# Patient Record
Sex: Male | Born: 1965
Health system: Southern US, Community
[De-identification: ages and names within clinical notes are randomized; demographics above are authoritative.]

## PROBLEM LIST (undated history)

## (undated) DIAGNOSIS — I10 Essential (primary) hypertension: Secondary | ICD-10-CM

## (undated) DIAGNOSIS — K760 Fatty (change of) liver, not elsewhere classified: Secondary | ICD-10-CM

## (undated) HISTORY — DX: Fatty (change of) liver, not elsewhere classified: K76.0

## (undated) HISTORY — PX: HERNIA REPAIR: SHX51

## (undated) HISTORY — DX: Essential (primary) hypertension: I10

## (undated) HISTORY — PX: ESOPHAGOGASTRODUODENOSCOPY: SHX1529

---

## 1988-07-19 LAB — HM HEPATITIS C SCREENING LAB: HM Hepatitis Screen: NEGATIVE

## 2004-07-22 ENCOUNTER — Ambulatory Visit: Payer: Self-pay | Admitting: Family Medicine

## 2005-02-02 ENCOUNTER — Ambulatory Visit: Payer: Self-pay | Admitting: Family Medicine

## 2005-12-24 ENCOUNTER — Ambulatory Visit: Payer: Self-pay | Admitting: Family Medicine

## 2005-12-31 ENCOUNTER — Ambulatory Visit: Payer: Self-pay | Admitting: Family Medicine

## 2006-01-14 ENCOUNTER — Ambulatory Visit: Payer: Self-pay | Admitting: Family Medicine

## 2006-01-27 ENCOUNTER — Ambulatory Visit: Payer: Self-pay | Admitting: Family Medicine

## 2006-05-10 ENCOUNTER — Ambulatory Visit: Payer: Self-pay | Admitting: Family Medicine

## 2006-05-12 ENCOUNTER — Ambulatory Visit: Payer: Self-pay | Admitting: Family Medicine

## 2006-08-18 ENCOUNTER — Ambulatory Visit: Payer: Self-pay | Admitting: Family Medicine

## 2007-05-02 DIAGNOSIS — I10 Essential (primary) hypertension: Secondary | ICD-10-CM | POA: Insufficient documentation

## 2007-05-19 ENCOUNTER — Ambulatory Visit: Payer: Self-pay | Admitting: Family Medicine

## 2007-05-19 LAB — CONVERTED CEMR LAB
BUN: 13 mg/dL (ref 6–23)
CO2: 29 meq/L (ref 19–32)
Calcium: 8.7 mg/dL (ref 8.4–10.5)
Chloride: 105 meq/L (ref 96–112)
Cholesterol: 155 mg/dL (ref 0–200)
Creatinine, Ser: 0.9 mg/dL (ref 0.4–1.5)
GFR calc Af Amer: 121 mL/min
GFR calc non Af Amer: 100 mL/min
Glucose, Bld: 109 mg/dL — ABNORMAL HIGH (ref 70–99)
HDL: 42.1 mg/dL (ref >39.0)
LDL Cholesterol: 102 mg/dL — ABNORMAL HIGH (ref 0–99)
Potassium: 4.1 meq/L (ref 3.5–5.1)
Sodium: 140 meq/L (ref 135–145)
TSH: 1.63 microintl units/mL (ref 0.35–5.50)
Total CHOL/HDL Ratio: 3.7
Triglycerides: 56 mg/dL (ref 0–149)
VLDL: 11 mg/dL (ref 0–40)

## 2007-05-24 ENCOUNTER — Ambulatory Visit: Payer: Self-pay | Admitting: Family Medicine

## 2007-08-29 ENCOUNTER — Ambulatory Visit: Payer: Self-pay | Admitting: Family Medicine

## 2008-09-02 ENCOUNTER — Ambulatory Visit: Payer: Self-pay | Admitting: Family Medicine

## 2008-09-02 DIAGNOSIS — E663 Overweight: Secondary | ICD-10-CM | POA: Insufficient documentation

## 2008-09-02 DIAGNOSIS — R0989 Other specified symptoms and signs involving the circulatory and respiratory systems: Secondary | ICD-10-CM | POA: Insufficient documentation

## 2008-09-02 DIAGNOSIS — R0609 Other forms of dyspnea: Secondary | ICD-10-CM | POA: Insufficient documentation

## 2008-11-27 ENCOUNTER — Ambulatory Visit: Payer: Self-pay | Admitting: Family Medicine

## 2008-11-27 LAB — CONVERTED CEMR LAB
ALT: 66 units/L — ABNORMAL HIGH (ref 0–53)
AST: 42 units/L — ABNORMAL HIGH (ref 0–37)
Albumin: 4.2 g/dL (ref 3.5–5.2)
Alkaline Phosphatase: 85 units/L (ref 39–117)
BUN: 16 mg/dL (ref 6–23)
Basophils Absolute: 0 10*3/uL (ref 0.0–0.1)
Basophils Relative: 0 % (ref 0.0–3.0)
Bilirubin, Direct: 0.1 mg/dL (ref 0.0–0.3)
CO2: 29 meq/L (ref 19–32)
Calcium: 9 mg/dL (ref 8.4–10.5)
Chloride: 107 meq/L (ref 96–112)
Cholesterol: 176 mg/dL (ref 0–200)
Creatinine, Ser: 0.9 mg/dL (ref 0.4–1.5)
Creatinine,U: 21.8 mg/dL
Eosinophils Absolute: 0.1 10*3/uL (ref 0.0–0.7)
Eosinophils Relative: 1.5 % (ref 0.0–5.0)
GFR calc non Af Amer: 98.73 mL/min (ref >60)
Glucose, Bld: 107 mg/dL — ABNORMAL HIGH (ref 70–99)
HCT: 44 % (ref 39.0–52.0)
HDL: 46.6 mg/dL (ref >39.00)
Hemoglobin: 15.7 g/dL (ref 13.0–17.0)
LDL Cholesterol: 116 mg/dL — ABNORMAL HIGH (ref 0–99)
Lymphocytes Relative: 34.1 % (ref 12.0–46.0)
Lymphs Abs: 1.8 10*3/uL (ref 0.7–4.0)
MCHC: 35.6 g/dL (ref 30.0–36.0)
MCV: 91.6 fL (ref 78.0–100.0)
Microalb Creat Ratio: 9.2 mg/g (ref 0.0–30.0)
Microalb, Ur: 0.2 mg/dL (ref 0.0–1.9)
Monocytes Absolute: 0.5 10*3/uL (ref 0.1–1.0)
Monocytes Relative: 9.8 % (ref 3.0–12.0)
Neutro Abs: 3 10*3/uL (ref 1.4–7.7)
Neutrophils Relative %: 54.6 % (ref 43.0–77.0)
Platelets: 187 10*3/uL (ref 150.0–400.0)
Potassium: 4.4 meq/L (ref 3.5–5.1)
RBC: 4.81 M/uL (ref 4.22–5.81)
RDW: 12 % (ref 11.5–14.6)
Sodium: 143 meq/L (ref 135–145)
TSH: 1.31 microintl units/mL (ref 0.35–5.50)
Total Bilirubin: 0.9 mg/dL (ref 0.3–1.2)
Total CHOL/HDL Ratio: 4
Total Protein: 6.9 g/dL (ref 6.0–8.3)
Triglycerides: 68 mg/dL (ref 0.0–149.0)
VLDL: 13.6 mg/dL (ref 0.0–40.0)
WBC: 5.4 10*3/uL (ref 4.5–10.5)

## 2008-12-03 ENCOUNTER — Ambulatory Visit: Payer: Self-pay | Admitting: Family Medicine

## 2009-03-06 ENCOUNTER — Ambulatory Visit: Payer: Self-pay | Admitting: Family Medicine

## 2009-12-04 ENCOUNTER — Ambulatory Visit: Payer: Self-pay | Admitting: Family Medicine

## 2009-12-04 DIAGNOSIS — E7800 Pure hypercholesterolemia, unspecified: Secondary | ICD-10-CM | POA: Insufficient documentation

## 2009-12-07 LAB — CONVERTED CEMR LAB
ALT: 42 units/L (ref 0–53)
AST: 26 units/L (ref 0–37)
Albumin: 4.4 g/dL (ref 3.5–5.2)
Alkaline Phosphatase: 94 units/L (ref 39–117)
BUN: 12 mg/dL (ref 6–23)
Bilirubin, Direct: 0.2 mg/dL (ref 0.0–0.3)
CO2: 29 meq/L (ref 19–32)
Calcium: 9.5 mg/dL (ref 8.4–10.5)
Chloride: 100 meq/L (ref 96–112)
Cholesterol: 164 mg/dL (ref 0–200)
Creatinine, Ser: 1 mg/dL (ref 0.4–1.5)
Creatinine,U: 13.7 mg/dL
GFR calc non Af Amer: 89.04 mL/min (ref >60)
Glucose, Bld: 96 mg/dL (ref 70–99)
HDL: 50.9 mg/dL (ref >39.00)
LDL Cholesterol: 91 mg/dL (ref 0–99)
Microalb Creat Ratio: 2.2 mg/g (ref 0.0–30.0)
Microalb, Ur: 0.3 mg/dL (ref 0.0–1.9)
Potassium: 4.9 meq/L (ref 3.5–5.1)
Sodium: 137 meq/L (ref 135–145)
TSH: 2.14 microintl units/mL (ref 0.35–5.50)
Total Bilirubin: 0.9 mg/dL (ref 0.3–1.2)
Total CHOL/HDL Ratio: 3
Total Protein: 7.1 g/dL (ref 6.0–8.3)
Triglycerides: 113 mg/dL (ref 0.0–149.0)
VLDL: 22.6 mg/dL (ref 0.0–40.0)

## 2009-12-10 ENCOUNTER — Ambulatory Visit: Payer: Self-pay | Admitting: Family Medicine

## 2010-01-14 ENCOUNTER — Ambulatory Visit: Payer: Self-pay | Admitting: Family Medicine

## 2010-01-21 ENCOUNTER — Ambulatory Visit: Payer: Self-pay | Admitting: Family Medicine

## 2010-06-08 ENCOUNTER — Telehealth: Payer: Self-pay | Admitting: Family Medicine

## 2010-08-18 NOTE — Assessment & Plan Note (Signed)
Summary: Marisue Ivan FROM [facility] F/U ON BP/DLO   Vital Signs:  Patient profile:   45 year old male Height:      68 inches Weight:      237.75 pounds BMI:     36.28 Temp:     98.5 degrees F oral Pulse rate:   84 / minute Pulse rhythm:   regular BP sitting:   132 / 80  (left arm) Cuff size:   large  Vitals Entered By: Delilah Shan CMA Koleen Celia Dull) (January 21, 2010 3:24 PM) CC: Transfer from Dr. Hetty Ely - F/U BP   History of Present Illness: Hypertension:      Using medication without problems or lightheadedness: yes Chest pain with exertion:no Edema:no Short of breath:no  Average home BPs:not checked.  Other issues: B foot pain- plantar pain.  Worse with first steps in AM, better after walking some.  No trauma, no edema.   Allergies: No Known Drug Allergies  Past History:  Past Medical History: Last updated: 05/02/2007 Hypertension  Family History: Last updated: 12/10/2009 Father: A 68 heart attack, coronary disease, and high blood pressure, COPD Mother: A 28 Brother A 39 Scott  divertics Brother A 37 Matt CV + Father MI, PGF CAD HBP + Father DM + in family (cousins) Breast/Ovarian/Uterine cancer + cousins Depression - none ETOH/Drug abuse - none Stroke + PGM, MGM  Review of Systems       See HPI.  Otherwise noncontributory.    Physical Exam  General:  GEN: nad, alert and oriented HEENT: mucous membranes moist NECK: supple w/o LA, no bruit CV: rrr.  no murmur PULM: ctab, no inc wob EXT: no edema SKIN: no acute rash  Feet:mild  B distal arch tenderness w/o erythema or other skin changes.    Impression & Recommendations:  Problem # 1:  HYPERTENSION (ICD-401.9) Assessment Improved Continue current meds.  His updated medication list for this problem includes:    Toprol Xl 50 Mg Xr24h-tab (Metoprolol succinate) ..... One tab by mouth at night  Complete Medication List: 1)  Toprol Xl 50 Mg Xr24h-tab (Metoprolol succinate) .... One tab by mouth at  night 2)  Rolaids  .... As needed 3)  Vitamin B-12 1000 Mcg Tabs (Cyanocobalamin) .... Otc 1 dauly with zinc  Patient Instructions: 1)  Get soft arch supports for shoes and stretch the bottom of your foot each morning before getting out of bed.  Continue your toprol 50mg  a day.  Try to cut down on salt.  Keep walking.   2)  Please schedule a follow-up appointment in 1 year.  Prescriptions: TOPROL XL 50 MG XR24H-TAB (METOPROLOL SUCCINATE) one tab by mouth at night  #30 x 12   Entered and Authorized by:   Crawford Givens MD   Signed by:   Crawford Givens MD on 01/21/2010   Method used:   [facility]        [facility]  [address] [phone][facility] (retail[facility]       [address]       [place], [place]  [address]       Ph: [phone]       Fax: [phone]   RxID:   [id number]   Current Allergies (reviewed today): No known allergies

## 2010-08-18 NOTE — Assessment & Plan Note (Signed)
Summary: CPX / LFW   Vital Signs:  Patient profile:   45 year old male Height:      68 inches Weight:      231 pounds BMI:     35.25 Temp:     98.9 degrees F oral Pulse rate:   88 / minute Pulse rhythm:   regular BP sitting:   148 / 78  (left arm) Cuff size:   large  Vitals Entered By: Sydell Axon LPN (Dec 10, 2009 2:50 PM) CC: 30 minute checkup   History of Present Illness: Pt here for Comp Exam. He has no complaints He has lots graduating the last few weeks and hasn't been able to exercise. He has no complaints.  Preventive Screening-Counseling & Management  Alcohol-Tobacco     Alcohol drinks/day: 2     Alcohol type: beer, scotch     Smoking Status: never     Passive Smoke Exposure: no  Caffeine-Diet-Exercise     Caffeine use/day: 3     Does Patient Exercise: yes     Type of exercise: walking     Times/week: 3  Problems Prior to Update: 1)  Hypercholesterolemia  (ICD-272.0) 2)  Snoring  (ICD-786.09) 3)  Overweight  (ICD-278.02) 4)  Screening For Lipoid Disorders  (ICD-V77.91) 5)  Health Maintenance Exam  (ICD-V70.0) 6)  Hypertension  (ICD-401.9) 7)  Hyperglycemia (107)  (ICD-790.29) 8)  Helicobacter Pylori Gastritis/esoph Stricture, Barrett's  (ICD-041.86)  Medications Prior to Update: 1)  Toprol Xl 50 Mg Xr24h-Tab (Metoprolol Succinate) .... One Tab By Mouth At Night 2)  Rolaids .... As Needed 3)  Vitamin B-12 1000 Mcg Tabs (Cyanocobalamin) .... Otc 1 Dauly With Zinc  Allergies: No Known Drug Allergies  Past History:  Past Medical History: Last updated: 05/02/2007 Hypertension  Past Surgical History: Last updated: 05/02/2007 Herniorraphy 2 wks old EGD, bx negative stricture secondary reflux, Barrett's negative 03/20/02, + H Pylori 03/20/02 ABD U/S negative, fatty liver, left kidney prom soft tissure - CT 01/27/06 CT abd/pelvis dromedary hump of kidney 02/03/06  Family History: Last updated: 12/10/2009 Father: A 68 heart attack, coronary  disease, and high blood pressure, COPD Mother: A 81 Brother A 39 Scott  divertics Brother A 37 Matt CV + Father MI, PGF CAD HBP + Father DM + in family (cousins) Breast/Ovarian/Uterine cancer + cousins Depression - none ETOH/Drug abuse - none Stroke + PGM, MGM  Social History: Last updated: 05/24/2007 Marital Status: Married Children: One son and one daughter one son Occupation: Works at [employer]  Risk Factors: Alcohol Use: 2 (12/10/2009) Caffeine Use: 3 (12/10/2009) Exercise: yes (12/10/2009)  Risk Factors: Smoking Status: never (12/10/2009) Passive Smoke Exposure: no (12/10/2009)  Family History: Father: A 68 heart attack, coronary disease, and high blood pressure, COPD Mother: A 73 Brother A 39 Scott  divertics Brother A 37 Matt CV + Father MI, PGF CAD HBP + Father DM + in family (cousins) Breast/Ovarian/Uterine cancer + cousins Depression - none ETOH/Drug abuse - none Stroke + PGM, MGM  Review of Systems General:  Complains of fatigue; denies chills, fever, sweats, weakness, and weight loss; late afternoon. Eyes:  Denies blurring, discharge, and eye irritation. ENT:  Complains of decreased hearing; denies earache and ringing in ears; R>L. CV:  Complains of swelling of hands; denies chest pain or discomfort, fainting, fatigue, palpitations, shortness of breath with exertion, swelling of feet, and weight gain. Resp:  Denies cough, shortness of breath, and wheezing. GI:  Denies abdominal pain, bloody stools, change in  bowel habits, constipation, dark tarry stools, diarrhea, indigestion, loss of appetite, nausea, vomiting, vomiting blood, and yellowish skin color. GU:  Denies discharge, dysuria, nocturia, and urinary frequency. MS:  Complains of low back pain; denies joint pain, muscle aches, cramps, and stiffness; occas. Derm:  Denies dryness, itching, and rash. Neuro:  Denies numbness, poor balance, tingling, and tremors.  Physical Exam  General:   Well-developed,well-nourished,in no acute distress; alert,appropriate and cooperative throughout examination, weight improving but still overweight . Head:  Normocephalic and atraumatic without obvious abnormalities. No apparent alopecia or balding. Sinuses nontender. Eyes:  Conjunctiva clear bilaterally.  Ears:  External ear exam shows no significant lesions or deformities.  Otoscopic examination reveals clear canals, tympanic membranes are intact bilaterally without bulging, retraction, inflammation or discharge. Hearing is grossly normal bilaterally. Nose:  External nasal examination shows no deformity or inflammation. Nasal mucosa are pink and moist without lesions or exudates. Mouth:  Oral mucosa and oropharynx without lesions or exudates.  Teeth in good repair. Neck:  No deformities, masses, or tenderness noted. Chest Wall:  No deformities, masses, tenderness or gynecomastia noted. Breasts:  No masses or gynecomastia noted Lungs:  Normal respiratory effort, chest expands symmetrically. Lungs are clear to auscultation, no crackles or wheezes. Heart:  Normal rate and regular rhythm. S1 and S2 normal without gallop, murmur, click, rub or other extra sounds. Abdomen:  Bowel sounds positive,abdomen soft and non-tender without masses, organomegaly or hernias noted. Rectal:  No external abnormalities noted. Normal sphincter tone. No rectal masses or tenderness. G neg. Genitalia:  Testes bilaterally descended without nodularity, tenderness or masses. No scrotal masses or lesions. No penis lesions or urethral discharge. Prostate:  Prostate gland firm and smooth, no enlargement, nodularity, tenderness, mass, asymmetry or induration. 10-20gms. Msk:  No deformity or scoliosis noted of thoracic or lumbar spine.   Pulses:  R and L carotid,radial,femoral,dorsalis pedis and posterior tibial pulses are full and equal bilaterally Extremities:  No clubbing, cyanosis, edema, or deformity noted with normal full  range of motion of all joints.   Neurologic:  No cranial nerve deficits noted. Station and gait are normal. Sensory, motor and coordinative functions appear intact. Skin:  Intact without suspicious lesions or rashes Cervical Nodes:  No lymphadenopathy noted Inguinal Nodes:  No significant adenopathy Psych:  Cognition and judgment appear intact. Alert and cooperative with normal attention span and concentration. No apparent delusions, illusions, hallucinations   Impression & Recommendations:  Problem # 1:  HEALTH MAINTENANCE EXAM (ICD-V70.0) Assessment Comment Only  Reviewed preventive care protocols, scheduled due services, and updated immunizations.  Problem # 2:  HYPERTENSION (ICD-401.9) Assessment: Deteriorated Up again today. Is taking only 1/2 tab of Toprol at night. Go to whole tab. His updated medication list for this problem includes:    Toprol Xl 50 Mg Xr24h-tab (Metoprolol succinate) ..... One tab by mouth at night  BP today: 148/78 Prior BP: 130/76 (03/06/2009)  Labs Reviewed: K+: 4.9 (12/04/2009) Creat: : 1.0 (12/04/2009)   Chol: 164 (12/04/2009)   HDL: 50.90 (12/04/2009)   LDL: 91 (12/04/2009)   TG: 113.0 (12/04/2009)  Problem # 3:  OVERWEIGHT (ICD-278.02) Assessment: Improved  Slowly gertting better, encouraged to cont.efforts.  Ht: 68 (12/10/2009)   Wt: 231 (12/10/2009)   BMI: 35.25 (12/10/2009)  Problem # 4:  SNORING (ICD-786.09) Assessment: Unchanged  Stable. His updated medication list for this problem includes:    Toprol Xl 50 Mg Xr24h-tab (Metoprolol succinate) ..... One tab by mouth at night  Problem # 5:  HYPERCHOLESTEROLEMIA (ICD-272.0)  Assessment: Unchanged Stable and good. Labs Reviewed: SGOT: 26 (12/04/2009)   SGPT: 42 (12/04/2009)   HDL:50.90 (12/04/2009), 46.60 (11/27/2008)  LDL:91 (12/04/2009), 116 (03/47/4259)  Chol:164 (12/04/2009), 176 (11/27/2008)  Trig:113.0 (12/04/2009), 68.0 (11/27/2008)  Problem # 6:  HYPERGLYCEMIA (107)  (ICD-790.29) Assessment: Unchanged Euglycemic today, congrats. Cont efforts. Diet and exercise.  Complete Medication List: 1)  Toprol Xl 50 Mg Xr24h-tab (Metoprolol succinate) .... One tab by mouth at night 2)  Rolaids  .... As needed 3)  Vitamin B-12 1000 Mcg Tabs (Cyanocobalamin) .... Otc 1 dauly with zinc  Patient Instructions: 1)  RTC one month for BP check. Prescriptions: TOPROL XL 50 MG XR24H-TAB (METOPROLOL SUCCINATE) one tab by mouth at night  #30 x 12   Entered and Authorized by:   Shaune Leeks MD   Signed by:   Shaune Leeks MD on 12/10/2009   Method used:   [facility]        [facility] [phone]* (retail)       [address]       [place], [place]  [address]       Ph: [phone]       Fax: [phone]   RxID:   [id number]   Current Allergies (reviewed today): No known allergies

## 2010-08-18 NOTE — Assessment & Plan Note (Signed)
Summary: cpx/rbh   Vital Signs:  Patient Profile:   45 Years Old Male Height:     68.50 inches Weight:      237 pounds Temp:     99.3 degrees F oral Pulse rate:   72 / minute Pulse rhythm:   regular BP sitting:   130 / 80  (left arm) Cuff size:   large  Vitals Entered By: Providence Crosby (May 24, 2007 8:33 AM)                 Chief Complaint:  check up// .  History of Present Illness: No complaints, feels well. Lots of stress.  Current Allergies: No known allergies    Family History:    Father: Alive with heart attack, coronary disease, and high blood pressure, COPD    Mother: Alive    Siblings: 2 Brothers, 1 with divertics    CV + Father MI, PGF CAD    HBP + Father    DM + in family (cousins)    Breast/Ovarian/Uterine cancer + cousins    Depression - none    ETOH/Drug abuse - none    Stroke + PGM, MGM          Social History:    Marital Status: Married    Children: One son and one daughter one son    Occupation: Works at [employer]   Risk Factors:  Passive smoke exposure:  no Drug use:  no HIV high-risk behavior:  no Caffeine use:  3 drinks per day Alcohol use:  yes    Type:  beer, scotch    Drinks per day:  2    Has patient --       Felt need to cut down:  no       Been annoyed by complaints:  no       Felt guilty about drinking:  no       Needed eye opener in the morning:  no    Counseled to quit/cut down alcohol use:  no Exercise:  yes    Times per week:  2    Type:  walking Seatbelt use:  100 %   Review of Systems  General      Denies chills, fatigue, fever, loss of appetite, malaise, sleep disorder, sweats, weakness, and weight loss.  Eyes      Denies blurring, discharge, double vision, eye irritation, eye pain, halos, itching, light sensitivity, red eye, vision loss-1 eye, and vision loss-both eyes.  ENT      Denies decreased hearing, difficulty swallowing, ear discharge, earache, hoarseness, nasal congestion,  nosebleeds, postnasal drainage, ringing in ears, sinus pressure, and sore throat.  CV      Denies bluish discoloration of lips or nails, chest pain or discomfort, difficulty breathing at night, difficulty breathing while lying down, fainting, fatigue, leg cramps with exertion, lightheadness, near fainting, palpitations, shortness of breath with exertion, swelling of feet, swelling of hands, and weight gain.  Resp      Denies chest discomfort, chest pain with inspiration, cough, coughing up blood, excessive snoring, hypersomnolence, morning headaches, pleuritic, shortness of breath, sputum productive, and wheezing.  GI      Denies abdominal pain, bloody stools, change in bowel habits, constipation, dark tarry stools, diarrhea, excessive appetite, gas, hemorrhoids, indigestion, loss of appetite, nausea, vomiting, vomiting blood, and yellowish skin color.  GU      Denies decreased libido, discharge, dysuria, erectile dysfunction, genital sores, hematuria, incontinence, nocturia, urinary frequency,  and urinary hesitancy.  MS      Complains of low back pain.      Denies joint pain, joint redness, joint swelling, loss of strength, mid back pain, muscle aches, muscle , cramps, muscle weakness, stiffness, and thoracic pain.      mild  Derm      Denies changes in color of skin, changes in nail beds, dryness, excessive perspiration, flushing, hair loss, insect bite(s), itching, lesion(s), poor wound healing, and rash.  Neuro      Denies brief paralysis, difficulty with concentration, disturbances in coordination, falling down, headaches, inability to speak, memory loss, numbness, poor balance, seizures, sensation of room spinning, tingling, tremors, visual disturbances, and weakness.   Physical Exam  General:     Well-developed,well-nourished,in no acute distress; alert,appropriate and cooperative throughout examination Head:     Normocephalic and atraumatic without obvious abnormalities. No  apparent alopecia or balding. Eyes:     Conjunctiva clear bilaterally.  Ears:     External ear exam shows no significant lesions or deformities.  Otoscopic examination reveals clear canals, tympanic membranes are intact bilaterally without bulging, retraction, inflammation or discharge. Hearing is grossly normal bilaterally. Nose:     External nasal examination shows no deformity or inflammation. Nasal mucosa are pink and moist without lesions or exudates. Mouth:     Oral mucosa and oropharynx without lesions or exudates.  Teeth in good repair. Neck:     No deformities, masses, or tenderness noted. Chest Wall:     No deformities, masses, tenderness or gynecomastia noted. Breasts:     No masses or gynecomastia noted Lungs:     Normal respiratory effort, chest expands symmetrically. Lungs are clear to auscultation, no crackles or wheezes. Heart:     Normal rate and regular rhythm. S1 and S2 normal without gallop, murmur, click, rub or other extra sounds. Abdomen:     Bowel sounds positive,abdomen soft and non-tender without masses, organomegaly or hernias noted. Rectal:     No external abnormalities noted. Normal sphincter tone. No rectal masses or tenderness. G neg. Genitalia:     Testes bilaterally descended without nodularity, tenderness or masses. No scrotal masses or lesions. No penis lesions or urethral discharge. Prostate:     Prostate gland firm and smooth, no enlargement, nodularity, tenderness, mass, asymmetry or induration. 20gms. Msk:     No deformity or scoliosis noted of thoracic or lumbar spine.   Pulses:     R and L carotid,radial,femoral,dorsalis pedis and posterior tibial pulses are full and equal bilaterally Extremities:     No clubbing, cyanosis, edema, or deformity noted with normal full range of motion of all joints.   Neurologic:     No cranial nerve deficits noted. Station and gait are normal. Plantar reflexes are down-going bilaterally. DTRs are symmetrical  throughout. Sensory, motor and coordinative functions appear intact. Skin:     Intact without suspicious lesions or rashes Cervical Nodes:     No lymphadenopathy noted Inguinal Nodes:     No significant adenopathy Psych:     Cognition and judgment appear intact. Alert and cooperative with normal attention span and concentration. No apparent delusions, illusions, hallucinations    Impression & Recommendations:  Problem # 1:  HEALTH MAINTENANCE EXAM (ICD-V70.0) Assessment: Comment Only  Problem # 2:  HYPERTENSION (ICD-401.9) Assessment: Unchanged Stable. His updated medication list for this problem includes:    Toprol Xl 25 Mg Tb24 (Metoprolol succinate) .Marland Kitchen... Take 1 tablet by mouth at bedtime  BP today: 130/80  Labs Reviewed: Creat: 0.9 (05/19/2007) Chol: 155 (05/19/2007)   HDL: 42.1 (05/19/2007)   LDL: 102 (05/19/2007)   TG: 56 (05/19/2007)   Problem # 3:  SCREENING FOR LIPOID DISORDERS (ICD-V77.91) Assessment: Unchanged Adequate.  Problem # 4:  HYPERGLYCEMIA (107) (ICD-790.29) Assessment: Unchanged Again reiterated watching sweets and carbs.  Weight loss will help. Labs Reviewed: Creat: 0.9 (05/19/2007)      Complete Medication List: 1)  Toprol Xl 25 Mg Tb24 (Metoprolol succinate) .... Take 1 tablet by mouth at bedtime 2)  Rolaids  .... As needed   Patient Instructions: 1)  Lose weight. 2)  RTC one year, sooner as needed.    ]

## 2010-08-18 NOTE — Assessment & Plan Note (Signed)
Summary: 3 MO. F/U/ BP CHECK/BIR   Vital Signs:  Patient profile:   45 year old male Weight:      232 pounds Temp:     98.5 degrees F oral Pulse rate:   76 / minute Pulse rhythm:   regular BP sitting:   130 / 76  (left arm) Cuff size:   large  Vitals Entered By: Lowella Petties CMA (March 06, 2009 2:50 PM) CC: 3 month follow up   History of Present Illness: Pt here for three month followup for BP. He was encouraged to lose weight which he has done. He has lost 20 pounds since Feb. He feels better, snoring less, doesn't think he jerks any more at night and generally is able to get around better. He has no complaints today. He was turned down for life insurance again since being seen, probably due to his famliy history.  Problems Prior to Update: 1)  Snoring  (ICD-786.09) 2)  Overweight  (ICD-278.02) 3)  Screening For Lipoid Disorders  (ICD-V77.91) 4)  Health Maintenance Exam  (ICD-V70.0) 5)  Hypertension  (ICD-401.9) 6)  Hyperglycemia (107)  (ICD-790.29) 7)  Helicobacter Pylori Gastritis/esoph Stricture, Barrett's  (ICD-041.86)  Medications Prior to Update: 1)  Toprol Xl 50 Mg Xr24h-Tab (Metoprolol Succinate) .... One Tab By Mouth At Night 2)  Rolaids .... As Needed 3)  Vitamin B-12 1000 Mcg Tabs (Cyanocobalamin) .... Otc 1 Dauly With Zinc  Allergies: No Known Drug Allergies  Physical Exam  General:  Well-developed,well-nourished,in no acute distress; alert,appropriate and cooperative throughout examination, weight improving. Head:  Normocephalic and atraumatic without obvious abnormalities. No apparent alopecia or balding. Sinuses nontender. Eyes:  Conjunctiva clear bilaterally.  Ears:  External ear exam shows no significant lesions or deformities.  Otoscopic examination reveals clear canals, tympanic membranes are intact bilaterally without bulging, retraction, inflammation or discharge. Hearing is grossly normal bilaterally. Nose:  External nasal examination shows no  deformity or inflammation. Nasal mucosa are pink and moist without lesions or exudates. Mouth:  Oral mucosa and oropharynx without lesions or exudates.  Teeth in good repair. Neck:  No deformities, masses, or tenderness noted. Chest Wall:  No deformities, masses, tenderness or gynecomastia noted. Lungs:  Normal respiratory effort, chest expands symmetrically. Lungs are clear to auscultation, no crackles or wheezes. Heart:  Normal rate and regular rhythm. S1 and S2 normal without gallop, murmur, click, rub or other extra sounds.   Impression & Recommendations:  Problem # 1:  HYPERTENSION (ICD-401.9) Assessment Improved  His updated medication list for this problem includes:    Toprol Xl 50 Mg Xr24h-tab (Metoprolol succinate) ..... One tab by mouth at night  BP today: 130/76 Prior BP: 140/80 (12/03/2008)  Labs Reviewed: K+: 4.4 (11/27/2008) Creat: : 0.9 (11/27/2008)   Chol: 176 (11/27/2008)   HDL: 46.60 (11/27/2008)   LDL: 116 (11/27/2008)   TG: 68.0 (11/27/2008)  Problem # 2:  HYPERGLYCEMIA (107) (ICD-790.29) Assessment: Improved  Am sure it is improved with 20 pound weight loss.  Labs Reviewed: Creat: 0.9 (11/27/2008)     Problem # 3:  OVERWEIGHT (ICD-278.02) Assessment: Improved  Great job. Challenge is to continue.  Ht: 68 (12/03/2008)   Wt: 232 (03/06/2009)   BMI: 37.08 (12/03/2008)  Complete Medication List: 1)  Toprol Xl 50 Mg Xr24h-tab (Metoprolol succinate) .... One tab by mouth at night 2)  Rolaids  .... As needed 3)  Vitamin B-12 1000 Mcg Tabs (Cyanocobalamin) .... Otc 1 dauly with zinc  Patient Instructions: 1)  RTC 6 mos,  COMP EXAM< labs prior  Prior Medications (reviewed today): TOPROL XL 50 MG XR24H-TAB (METOPROLOL SUCCINATE) one tab by mouth at night ROLAIDS () as needed VITAMIN B-12 1000 MCG TABS (CYANOCOBALAMIN) OTC 1 DAULY WITH ZINC Current Allergies: No known allergies

## 2010-08-18 NOTE — Assessment & Plan Note (Signed)
Summary: SNORING ISSUES / LFW   Vital Signs:  Patient Profile:   45 Years Old Male Height:     68.50 inches Weight:      252 pounds Temp:     98.4 degrees F oral Pulse rate:   76 / minute Pulse rhythm:   regular BP sitting:   140 / 90  (left arm) Cuff size:   large  Vitals Entered By: Providence Crosby (September 02, 2008 8:18 AM)                 Chief Complaint:  SNORING ISSUES.  History of Present Illness: Pt here for snoring. He has gained 13 pounds in the last year.  BP also on cusp today. Wants to avoid CPAP if possible. Knows he is overweight. Felt good when he was down to 220.    Prior Medications Reviewed Using: Patient Recall  Current Allergies: No known allergies       Physical Exam  General:     Well-developed,well-nourished,in no acute distress; alert,appropriate and cooperative throughout examination, overweight Head:     Normocephalic and atraumatic without obvious abnormalities. No apparent alopecia or balding. Eyes:     Conjunctiva clear bilaterally.  Ears:     External ear exam shows no significant lesions or deformities.  Otoscopic examination reveals clear canals, tympanic membranes are intact bilaterally without bulging, retraction, inflammation or discharge. Hearing is grossly normal bilaterally. Nose:     External nasal examination shows no deformity or inflammation. Nasal mucosa are pink and moist without lesions or exudates. Mouth:     Oral mucosa and oropharynx without lesions or exudates.  Teeth in good repair. Neck:     No deformities, masses, or tenderness noted. Chest Wall:     No deformities, masses, tenderness or gynecomastia noted. Lungs:     Normal respiratory effort, chest expands symmetrically. Lungs are clear to auscultation, no crackles or wheezes. Heart:     Normal rate and regular rhythm. S1 and S2 normal without gallop, murmur, click, rub or other extra sounds.    Impression & Recommendations:  Problem # 1:   OVERWEIGHT (ICD-278.02) Assessment: Unchanged Recurrent. Encouraged to lose weight with low carb diet which was discussed and regular aerobic exercise....eqiuv of walking an hour making him breathe hard.  Problem # 2:  SNORING (ICD-786.09) And probably sleep apnea at present, both will improve with weight loss. His updated medication list for this problem includes:    Toprol Xl 25 Mg Tb24 (Metoprolol succinate) .Marland Kitchen... Take 1 tablet by mouth at bedtime   Problem # 3:  HYPERTENSION (ICD-401.9) Assessment: Deteriorated Will impr with weight loss. His updated medication list for this problem includes:    Toprol Xl 25 Mg Tb24 (Metoprolol succinate) .Marland Kitchen... Take 1 tablet by mouth at bedtime  BP today: 140/90 Prior BP: 136/90 (08/29/2007)  Labs Reviewed: Creat: 0.9 (05/19/2007) Chol: 155 (05/19/2007)   HDL: 42.1 (05/19/2007)   LDL: 102 (05/19/2007)   TG: 56 (05/19/2007)   Problem # 4:  HYPERGLYCEMIA (107) (ICD-790.29) Assessment: Unchanged Will improve with weight loss. Father is diabetic....his risk is high, esp with current weight. Labs Reviewed: Creat: 0.9 (05/19/2007)      Complete Medication List: 1)  Toprol Xl 25 Mg Tb24 (Metoprolol succinate) .... Take 1 tablet by mouth at bedtime 2)  Rolaids  .... As needed 3)  Vitamin B-12 1000 Mcg Tabs (Cyanocobalamin) .... Otc 1 dauly with zinc   Patient Instructions: 1)  Will recheck in MAy and refer for sllep  study if no impr.

## 2010-08-18 NOTE — Assessment & Plan Note (Signed)
Summary: ST/CLE   Vital Signs:  Patient Profile:   45 Years Old Male Height:     68.50 inches Weight:      239 pounds Temp:     99.3 degrees F oral Pulse rate:   76 / minute Pulse rhythm:   regular BP sitting:   136 / 90  (left arm) Cuff size:   large  Vitals Entered By: Providence Crosby (August 29, 2007 10:44 AM)                 Chief Complaint:  sore throat x 2 or 3 weeks //sinus congestion.  History of Present Illness: Hasn't taken medicine for BP last two nights. Has had one month ago soreness since exposure to strep form patron at business, feel swollen on left side which comes and goes. No fever/chills, mild headache with ear congestion yesterday, no rhinitis, no cough.     Current Allergies: No known allergies       Physical Exam  General:     Well-developed,well-nourished,in no acute distress; alert,appropriate and cooperative throughout examination Head:     Normocephalic and atraumatic without obvious abnormalities. No apparent alopecia or balding. Eyes:     Conjunctiva clear bilaterally.  Ears:     External ear exam shows no significant lesions or deformities.  Otoscopic examination reveals clear canals, tympanic membranes are intact bilaterally without bulging, retraction, inflammation or discharge. Hearing is grossly normal bilaterally. Nose:     External nasal examination shows no deformity or inflammation. Nasal mucosa are pink and moist without lesions or exudates. Mouth:     Oral mucosa and oropharynx without lesions or exudates.  Teeth in good repair. Tonsillar pillar area mildly inflamed. Neck:     No deformities, masses, or tenderness noted. Chest Wall:     No deformities, masses, tenderness or gynecomastia noted. Lungs:     Normal respiratory effort, chest expands symmetrically. Lungs are clear to auscultation, no crackles or wheezes. Heart:     Normal rate and regular rhythm. S1 and S2 normal without gallop, murmur, click, rub or other  extra sounds.    Impression & Recommendations:  Problem # 1:  PHARYNGITIS-ACUTE (ICD-462) Assessment: New RST negative. See instructions. Orders: Rapid Strep (16109)   Problem # 2:  HYPERTENSION (ICD-401.9) Assessment: Deteriorated Can't afford to miss meds  His updated medication list for this problem includes:    Toprol Xl 25 Mg Tb24 (Metoprolol succinate) .Marland Kitchen... Take 1 tablet by mouth at bedtime  BP today: 136/90 Prior BP: 130/80 (05/24/2007)  Labs Reviewed: Creat: 0.9 (05/19/2007) Chol: 155 (05/19/2007)   HDL: 42.1 (05/19/2007)   LDL: 102 (05/19/2007)   TG: 56 (05/19/2007)  His updated medication list for this problem includes:    Toprol Xl 25 Mg Tb24 (Metoprolol succinate) .Marland Kitchen... Take 1 tablet by mouth at bedtime   Problem # 3:  HYPERGLYCEMIA (107) (ICD-790.29) Assessment: Unchanged Reminded to avoid sweets and carbs. Labs Reviewed: Creat: 0.9 (05/19/2007)      Complete Medication List: 1)  Toprol Xl 25 Mg Tb24 (Metoprolol succinate) .... Take 1 tablet by mouth at bedtime 2)  Rolaids  .... As needed   Patient Instructions: 1)  Your rapid stress test was negative. 2)  Gargle with warm salt water every half hour for two days.  3)  See me as needed.    ]

## 2010-08-18 NOTE — Progress Notes (Signed)
Summary: refill request for toprol  Phone Note Refill Request Call back at [phone] Message from:  wife  Refills Requested: Medication #1:  TOPROL XL 50 MG XR24H-TAB one tab by mouth at night Wife is requesting a written 90 day script to send to [facility], they told her they never got the refills that were sent in in august.  Initial call taken by: Lowella Petties CMA, AAMA,  June 08, 2010 5:19 PM  Follow-up for Phone Call        please give to patient.  I reviewed th chart, and it appears the prev fax did go through.  I am unsure why this wasn't filled by [facility].   Follow-up by: Crawford Givens MD,  June 08, 2010 5:21 PM  Additional Follow-up for Phone Call Additional follow up Details #1::        Patient Advised. Prescription left at front desk.  Additional Follow-up by: Delilah Shan CMA Genell Thede Dull),  June 08, 2010 5:26 PM    Prescriptions: TOPROL XL 50 MG XR24H-TAB (METOPROLOL SUCCINATE) one tab by mouth at night  #90 x 3   Entered and Authorized by:   Crawford Givens MD   Signed by:   Crawford Givens MD on 06/08/2010   Method used:   Print then Give to Patient   RxID:   [id number]

## 2010-08-18 NOTE — Assessment & Plan Note (Signed)
Summary: CPX/CLE   Vital Signs:  Patient profile:   45 year old male Height:      68 inches Weight:      243 pounds BMI:     37.08 Temp:     76 degrees F oral Pulse rate:   76 / minute Pulse rhythm:   regular BP sitting:   140 / 80  (left arm) Cuff size:   large  Vitals Entered By: Providence Crosby (Dec 03, 2008 2:01 PM) CC: check up   History of Present Illness: Pt here for Comp Exam, has lost 9 pounds since Feb. He is sleeping better and snoring less. He is feeeling well without problems.  Preventive Screening-Counseling & Management     Alcohol drinks/day: 2     Alcohol type: beer, scotch     Smoking Status: never     Passive Smoke Exposure: no     Caffeine use/day: 3     Does Patient Exercise: yes     Type of exercise: walking     Times/week: 3  Problems Prior to Update: 1)  Snoring  (ICD-786.09) 2)  Overweight  (ICD-278.02) 3)  Screening For Lipoid Disorders  (ICD-V77.91) 4)  Health Maintenance Exam  (ICD-V70.0) 5)  Hypertension  (ICD-401.9) 6)  Hyperglycemia (107)  (ICD-790.29) 7)  Helicobacter Pylori Gastritis/esoph Stricture, Barrett's  (ICD-041.86)  Medications Prior to Update: 1)  Toprol Xl 25 Mg Tb24 (Metoprolol Succinate) .... Take 1 Tablet By Mouth At Bedtime 2)  Rolaids .... As Needed 3)  Vitamin B-12 1000 Mcg Tabs (Cyanocobalamin) .... Otc 1 Dauly With Zinc  Allergies (verified): No Known Drug Allergies  Past History:  Past Medical History:    Hypertension     (05/02/2007)  Past Surgical History:    Herniorraphy 2 wks old    EGD, bx negative stricture secondary reflux, Barrett's negative 03/20/02, + H Pylori 03/20/02    ABD U/S negative, fatty liver, left kidney prom soft tissure - CT 01/27/06    CT abd/pelvis dromedary hump of kidney 02/03/06     (05/02/2007)  Family History:    Father: Alive with heart attack, coronary disease, and high blood pressure, COPD    Mother: Alive    Siblings: 2 Brothers, 1 with divertics    CV + Father  MI, PGF CAD    HBP + Father    DM + in family (cousins)    Breast/Ovarian/Uterine cancer + cousins    Depression - none    ETOH/Drug abuse - none    Stroke + PGM, MGM     (05/24/2007)  Social History:    Marital Status: Married    Children: One son and one daughter one son    Occupation: Works at [employer]     (05/24/2007)  Risk Factors:    Alcohol Use: 2 (05/24/2007)    >5 drinks/d w/in last 3 months: N/A    Caffeine Use: 3 (05/24/2007)    Diet: N/A    Exercise: yes (05/24/2007)  Risk Factors:    Smoking Status: never (05/02/2007)    Packs/Day: N/A    Cigars/wk: N/A    Pipe Use/wk: N/A    Cans of tobacco/wk: N/A    Passive Smoke Exposure: no (05/24/2007)  Family History:    Father: A 67 heart attack, coronary disease, and high blood pressure, COPD    Mother: A 68    Brother A 38 Scott  divertics    Brother A 36 Matt    CV +  Father MI, PGF CAD    HBP + Father    DM + in family (cousins)    Breast/Ovarian/Uterine cancer + cousins    Depression - none    ETOH/Drug abuse - none    Stroke + PGM, MGM          Review of Systems General:  Denies chills, fatigue, fever, loss of appetite, malaise, sleep disorder, sweats, weakness, and weight loss. Eyes:  Complains of blurring; witr5h tiredness, late in evening. ENT:  Denies decreased hearing, difficulty swallowing, ear discharge, earache, hoarseness, nasal congestion, nosebleeds, postnasal drainage, ringing in ears, sinus pressure, and sore throat. CV:  Denies bluish discoloration of lips or nails, chest pain or discomfort, difficulty breathing at night, difficulty breathing while lying down, fainting, fatigue, leg cramps with exertion, lightheadness, near fainting, palpitations, shortness of breath with exertion, swelling of feet, swelling of hands, and weight gain. Resp:  Denies chest discomfort, chest pain with inspiration, cough, coughing up blood, excessive snoring, hypersomnolence, morning headaches,  pleuritic, shortness of breath, sputum productive, and wheezing. GI:  Denies abdominal pain, bloody stools, change in bowel habits, constipation, dark tarry stools, diarrhea, excessive appetite, gas, hemorrhoids, indigestion, loss of appetite, nausea, vomiting, vomiting blood, and yellowish skin color; occas swalllowing problems, HH. GU:  Denies decreased libido, discharge, dysuria, erectile dysfunction, genital sores, hematuria, incontinence, nocturia, urinary frequency, and urinary hesitancy. MS:  Complains of joint pain; denies joint redness, joint swelling, loss of strength, low back pain, mid back pain, muscle aches, muscle , cramps, muscle weakness, stiffness, and thoracic pain; upper back/neck and knees /ankles. Derm:  Denies changes in color of skin, changes in nail beds, dryness, excessive perspiration, flushing, hair loss, insect bite(s), itching, lesion(s), poor wound healing, and rash. Neuro:  Denies brief paralysis, difficulty with concentration, disturbances in coordination, falling down, headaches, inability to speak, memory loss, numbness, poor balance, seizures, sensation of room spinning, tingling, tremors, visual disturbances, and weakness.  Physical Exam  General:  Well-developed,well-nourished,in no acute distress; alert,appropriate and cooperative throughout examination, overweight but improving. Head:  Normocephalic and atraumatic without obvious abnormalities. No apparent alopecia or balding. Sinuses nontender. Eyes:  Conjunctiva clear bilaterally.  Ears:  External ear exam shows no significant lesions or deformities.  Otoscopic examination reveals clear canals, tympanic membranes are intact bilaterally without bulging, retraction, inflammation or discharge. Hearing is grossly normal bilaterally. Nose:  External nasal examination shows no deformity or inflammation. Nasal mucosa are pink and moist without lesions or exudates. Mouth:  Oral mucosa and oropharynx without lesions or  exudates.  Teeth in good repair. Neck:  No deformities, masses, or tenderness noted. Chest Wall:  No deformities, masses, tenderness or gynecomastia noted. Breasts:  No masses or gynecomastia noted Lungs:  Normal respiratory effort, chest expands symmetrically. Lungs are clear to auscultation, no crackles or wheezes. Heart:  Normal rate and regular rhythm. S1 and S2 normal without gallop, murmur, click, rub or other extra sounds. Abdomen:  Bowel sounds positive,abdomen soft and non-tender without masses, organomegaly or hernias noted. Rectal:  No external abnormalities noted. Normal sphincter tone. No rectal masses or tenderness. G neg. Genitalia:  Testes bilaterally descended without nodularity, tenderness or masses. No scrotal masses or lesions. No penis lesions or urethral discharge. Prostate:  Prostate gland firm and smooth, no enlargement, nodularity, tenderness, mass, asymmetry or induration. 20gms. Msk:  No deformity or scoliosis noted of thoracic or lumbar spine.   Pulses:  R and L carotid,radial,femoral,dorsalis pedis and posterior tibial pulses are full and equal bilaterally Extremities:  No clubbing, cyanosis, edema, or deformity noted with normal full range of motion of all joints.   Neurologic:  No cranial nerve deficits noted. Station and gait are normal. Plantar reflexes are down-going bilaterally. DTRs are symmetrical throughout. Sensory, motor and coordinative functions appear intact. Skin:  Intact without suspicious lesions or rashes Cervical Nodes:  No lymphadenopathy noted Inguinal Nodes:  No significant adenopathy Psych:  Cognition and judgment appear intact. Alert and cooperative with normal attention span and concentration. No apparent delusions, illusions, hallucinations   Impression & Recommendations:  Problem # 1:  HEALTH MAINTENANCE EXAM (ICD-V70.0) Assessment Comment Only  Problem # 2:  OVERWEIGHT (ICD-278.02) Assessment: Improved  Encouraged to cont  efforts.  Ht: 68 (12/03/2008)   Wt: 243 (12/03/2008)   BMI: 37.08 (12/03/2008)  Problem # 3:  SNORING (ICD-786.09) Assessment: Improved  His updated medication list for thisWt loss has helped. problem includes:    Toprol Xl 50 Mg Xr24h-tab (Metoprolol succinate) ..... One tab by mouth at night  Problem # 4:  SCREENING FOR LIPOID DISORDERS (ICD-V77.91) Acceptable but try to get LDL lower, father has h/o CAD.  Problem # 5:  HYPERTENSION (ICD-401.9) Assessment: Unchanged  Still mildly elevated....incr Toprol to 50 mg per day. His updated medication list for this problem includes:    Toprol Xl 50 Mg Xr24h-tab (Metoprolol succinate) ..... One tab by mouth at night  BP today: 140/80 Prior BP: 140/90 (09/02/2008)  Labs Reviewed: K+: 4.4 (11/27/2008) Creat: : 0.9 (11/27/2008)   Chol: 176 (11/27/2008)   HDL: 46.60 (11/27/2008)   LDL: 116 (11/27/2008)   TG: 68.0 (11/27/2008)  Problem # 6:  HYPERGLYCEMIA (107) (ICD-790.29) Assessment: Unchanged  Still elevated but stable. Cont efforts at sweets and carbs. Kidney fctn ok.  Labs Reviewed: Creat: 0.9 (11/27/2008)     Problem # 7:  HELICOBACTER PYLORI GASTRITIS/ESOPH STRICTURE, BARRETT'S (ICD-041.86) Assessment: Unchanged Stable.  Complete Medication List: 1)  Toprol Xl 50 Mg Xr24h-tab (Metoprolol succinate) .... One tab by mouth at night 2)  Rolaids  .... As needed 3)  Vitamin B-12 1000 Mcg Tabs (Cyanocobalamin) .... Otc 1 dauly with zinc  Patient Instructions: 1)  RTC 3 mos, BP check Prescriptions: TOPROL XL 50 MG XR24H-TAB (METOPROLOL SUCCINATE) one tab by mouth at night  #30 x 12   Entered and Authorized by:   Shaune Leeks MD   Signed by:   Shaune Leeks MD on 12/03/2008   Method used:   Electronically to        [facility] [phone]* (retail)       [address]       [place], [place]  [address]       Ph: [phone]       Fax: [phone]   RxID:   [id number]

## 2010-12-08 ENCOUNTER — Other Ambulatory Visit: Payer: Self-pay

## 2010-12-08 ENCOUNTER — Other Ambulatory Visit: Payer: Self-pay | Admitting: Family Medicine

## 2010-12-08 DIAGNOSIS — I10 Essential (primary) hypertension: Secondary | ICD-10-CM

## 2010-12-09 ENCOUNTER — Other Ambulatory Visit (INDEPENDENT_AMBULATORY_CARE_PROVIDER_SITE_OTHER): Payer: 59 | Admitting: Family Medicine

## 2010-12-09 DIAGNOSIS — I10 Essential (primary) hypertension: Secondary | ICD-10-CM

## 2010-12-09 LAB — COMPREHENSIVE METABOLIC PANEL
ALT: 32 U/L (ref 0–53)
AST: 23 U/L (ref 0–37)
Albumin: 4.1 g/dL (ref 3.5–5.2)
Alkaline Phosphatase: 57 U/L (ref 39–117)
BUN: 16 mg/dL (ref 6–23)
CO2: 29 mEq/L (ref 19–32)
Calcium: 9 mg/dL (ref 8.4–10.5)
Chloride: 102 mEq/L (ref 96–112)
Creatinine, Ser: 0.8 mg/dL (ref 0.4–1.5)
GFR: 105.87 mL/min (ref >60.00)
Glucose, Bld: 103 mg/dL — ABNORMAL HIGH (ref 70–99)
Potassium: 4.6 mEq/L (ref 3.5–5.1)
Sodium: 138 mEq/L (ref 135–145)
Total Bilirubin: 0.6 mg/dL (ref 0.3–1.2)
Total Protein: 6.6 g/dL (ref 6.0–8.3)

## 2010-12-09 LAB — LIPID PANEL
Cholesterol: 158 mg/dL (ref 0–200)
HDL: 48.6 mg/dL (ref >39.00)
LDL Cholesterol: 94 mg/dL (ref 0–99)
Total CHOL/HDL Ratio: 3
Triglycerides: 77 mg/dL (ref 0.0–149.0)
VLDL: 15.4 mg/dL (ref 0.0–40.0)

## 2010-12-17 ENCOUNTER — Encounter: Payer: Self-pay | Admitting: Family Medicine

## 2010-12-18 ENCOUNTER — Encounter: Payer: Self-pay | Admitting: Family Medicine

## 2010-12-18 ENCOUNTER — Ambulatory Visit (INDEPENDENT_AMBULATORY_CARE_PROVIDER_SITE_OTHER): Payer: 59 | Admitting: Family Medicine

## 2010-12-18 DIAGNOSIS — E663 Overweight: Secondary | ICD-10-CM

## 2010-12-18 DIAGNOSIS — I10 Essential (primary) hypertension: Secondary | ICD-10-CM

## 2010-12-18 DIAGNOSIS — Z0000 Encounter for general adult medical examination without abnormal findings: Secondary | ICD-10-CM

## 2010-12-18 MED ORDER — METOPROLOL SUCCINATE ER 50 MG PO TB24: 50.0000 mg | ORAL_TABLET | Freq: Every day | ORAL | Status: DC

## 2010-12-18 NOTE — Progress Notes (Signed)
CPE- See plan.  Routine anticipatory guidance given to patient.  See health maintenance.  Hypertension:    Using medication without problems or lightheadedness: yes Chest pain with exertion:no Edema:no Short of breath:no  PMH and SH reviewed  Meds, vitals, and allergies reviewed.   ROS: See HPI.  Otherwise negative.    GEN: nad, alert and oriented HEENT: mucous membranes moist NECK: supple w/o LA CV: rrr. PULM: ctab, no inc wob ABD: soft, +bs EXT: no edema SKIN: no acute rash

## 2010-12-18 NOTE — Patient Instructions (Signed)
Keep taking the blood pressure meds and working on your weight.  I would recheck your labs at a physical next year.  I would get a flu shot each fall.   Take care. Glad to see you.

## 2010-12-20 ENCOUNTER — Encounter: Payer: Self-pay | Admitting: Family Medicine

## 2010-12-20 DIAGNOSIS — Z0000 Encounter for general adult medical examination without abnormal findings: Secondary | ICD-10-CM | POA: Insufficient documentation

## 2010-12-20 NOTE — Assessment & Plan Note (Signed)
Controlled, labs d/w pt.  D/w pt about weight/diet/exercise/tobacco.

## 2010-12-20 NOTE — Assessment & Plan Note (Signed)
Consider colon/prostate CA screening at 50, no indication for early screening.  Labs d/w pt.  Flu/tdap d/w pt.  Fu 1 year, sooner prn.

## 2010-12-20 NOTE — Assessment & Plan Note (Signed)
D/w pt about weight loss.  

## 2011-06-07 ENCOUNTER — Other Ambulatory Visit: Payer: Self-pay | Admitting: *Deleted

## 2011-06-07 MED ORDER — METOPROLOL SUCCINATE ER 50 MG PO TB24: 50.0000 mg | ORAL_TABLET | Freq: Every day | ORAL | Status: DC

## 2012-01-17 ENCOUNTER — Other Ambulatory Visit: Payer: Self-pay | Admitting: Family Medicine

## 2012-02-24 ENCOUNTER — Other Ambulatory Visit: Payer: Self-pay | Admitting: Family Medicine

## 2012-02-24 DIAGNOSIS — I10 Essential (primary) hypertension: Secondary | ICD-10-CM

## 2012-02-29 ENCOUNTER — Other Ambulatory Visit (INDEPENDENT_AMBULATORY_CARE_PROVIDER_SITE_OTHER): Payer: PRIVATE HEALTH INSURANCE

## 2012-02-29 DIAGNOSIS — I10 Essential (primary) hypertension: Secondary | ICD-10-CM

## 2012-02-29 LAB — LIPID PANEL
Cholesterol: 181 mg/dL (ref 0–200)
HDL: 67.9 mg/dL (ref >39.00)
LDL Cholesterol: 103 mg/dL — ABNORMAL HIGH (ref 0–99)
Total CHOL/HDL Ratio: 3
Triglycerides: 49 mg/dL (ref 0.0–149.0)
VLDL: 9.8 mg/dL (ref 0.0–40.0)

## 2012-02-29 LAB — COMPREHENSIVE METABOLIC PANEL
ALT: 60 U/L — ABNORMAL HIGH (ref 0–53)
AST: 46 U/L — ABNORMAL HIGH (ref 0–37)
Albumin: 4.5 g/dL (ref 3.5–5.2)
Alkaline Phosphatase: 68 U/L (ref 39–117)
BUN: 16 mg/dL (ref 6–23)
CO2: 27 mEq/L (ref 19–32)
Calcium: 8.9 mg/dL (ref 8.4–10.5)
Chloride: 102 mEq/L (ref 96–112)
Creatinine, Ser: 0.9 mg/dL (ref 0.4–1.5)
GFR: 97.22 mL/min (ref >60.00)
Glucose, Bld: 108 mg/dL — ABNORMAL HIGH (ref 70–99)
Potassium: 4.4 mEq/L (ref 3.5–5.1)
Sodium: 136 mEq/L (ref 135–145)
Total Bilirubin: 0.9 mg/dL (ref 0.3–1.2)
Total Protein: 7.5 g/dL (ref 6.0–8.3)

## 2012-03-03 ENCOUNTER — Encounter: Payer: Self-pay | Admitting: Family Medicine

## 2012-03-03 ENCOUNTER — Ambulatory Visit (INDEPENDENT_AMBULATORY_CARE_PROVIDER_SITE_OTHER): Payer: PRIVATE HEALTH INSURANCE | Admitting: Family Medicine

## 2012-03-03 VITALS — BP 132/76 | HR 82 | Temp 98.5°F | Wt 239.0 lb

## 2012-03-03 DIAGNOSIS — R0609 Other forms of dyspnea: Secondary | ICD-10-CM

## 2012-03-03 DIAGNOSIS — R0989 Other specified symptoms and signs involving the circulatory and respiratory systems: Secondary | ICD-10-CM

## 2012-03-03 DIAGNOSIS — K760 Fatty (change of) liver, not elsewhere classified: Secondary | ICD-10-CM

## 2012-03-03 DIAGNOSIS — K7689 Other specified diseases of liver: Secondary | ICD-10-CM

## 2012-03-03 DIAGNOSIS — I10 Essential (primary) hypertension: Secondary | ICD-10-CM

## 2012-03-03 DIAGNOSIS — E663 Overweight: Secondary | ICD-10-CM

## 2012-03-03 DIAGNOSIS — Z0000 Encounter for general adult medical examination without abnormal findings: Secondary | ICD-10-CM

## 2012-03-03 NOTE — Assessment & Plan Note (Signed)
Needs to lose weight, but doesn't appear to need OSA testing.

## 2012-03-03 NOTE — Assessment & Plan Note (Signed)
D/ wpt about weight loss and cutting out fatty foods, etoh.

## 2012-03-03 NOTE — Assessment & Plan Note (Signed)
Routine anticipatory guidance given to patient.  See health maintenance. Tetanus 2007 Flu shot encouraged Labs d/w pt Diet and exercise d/w pt.

## 2012-03-03 NOTE — Assessment & Plan Note (Signed)
D/w pt about path/phys.  Needs to cut out fatty foods, restrict/cut out etoh and lose weight.  He understood.

## 2012-03-03 NOTE — Progress Notes (Signed)
CPE- See plan.  Routine anticipatory guidance given to patient.  See health maintenance. Tetanus 2007 Flu shot encouraged Labs d/w pt Diet and exercise d/w pt.   Hypertension:    Using medication without problems or lightheadedness: yes Chest pain with exertion:no Edema:no Short of breath:no  H/o fatty liver.  Weight isn't decreased.  D/w pt re: labs and path/phys.   H/o snoring.  Not waking tired, not napping later in the day but occ tired.   PMH and SH reviewed  Meds, vitals, and allergies reviewed.   ROS: See HPI.  Otherwise negative.    GEN: nad, alert and oriented, obese.  HEENT: mucous membranes moist NECK: supple w/o LA CV: rrr. PULM: ctab, no inc wob ABD: soft, +bs EXT: no edema SKIN: no acute rash

## 2012-03-03 NOTE — Patient Instructions (Addendum)
I would get a flu shot each fall.   Work on your weight and cut back on alcohol.  Take care.   Recheck labs before a physical next year.

## 2012-03-03 NOTE — Assessment & Plan Note (Signed)
Controlled, continue current meds, needs to lose weight.  

## 2012-09-19 ENCOUNTER — Other Ambulatory Visit: Payer: Self-pay | Admitting: Family Medicine

## 2012-09-19 NOTE — Telephone Encounter (Signed)
Pt requesting printed copy of Metoprolol RX 90 day supply.  RX refill previously sent to [facility], offered to send it electronically to them, pt states his insurance has recently changed and he does not know who their mail order pharmacy is and would rather pick it up.  Please notify pt when ready for pick up.

## 2012-09-20 MED ORDER — METOPROLOL SUCCINATE ER 50 MG PO TB24: 50.0000 mg | ORAL_TABLET | Freq: Every day | ORAL | Status: DC

## 2012-09-20 NOTE — Telephone Encounter (Signed)
Left v/m for pt to call back. rx at front desk for pick up.

## 2012-09-20 NOTE — Telephone Encounter (Signed)
Printed.  Thanks.  

## 2013-02-21 ENCOUNTER — Other Ambulatory Visit: Payer: Self-pay | Admitting: Family Medicine

## 2013-02-21 DIAGNOSIS — I10 Essential (primary) hypertension: Secondary | ICD-10-CM

## 2013-02-26 ENCOUNTER — Other Ambulatory Visit: Payer: PRIVATE HEALTH INSURANCE

## 2013-02-28 ENCOUNTER — Other Ambulatory Visit (INDEPENDENT_AMBULATORY_CARE_PROVIDER_SITE_OTHER): Payer: 59

## 2013-02-28 DIAGNOSIS — I10 Essential (primary) hypertension: Secondary | ICD-10-CM

## 2013-02-28 LAB — COMPREHENSIVE METABOLIC PANEL
ALT: 58 U/L — ABNORMAL HIGH (ref 0–53)
AST: 31 U/L (ref 0–37)
Albumin: 4.2 g/dL (ref 3.5–5.2)
Alkaline Phosphatase: 66 U/L (ref 39–117)
BUN: 17 mg/dL (ref 6–23)
CO2: 30 mEq/L (ref 19–32)
Calcium: 9.2 mg/dL (ref 8.4–10.5)
Chloride: 102 mEq/L (ref 96–112)
Creatinine, Ser: 0.9 mg/dL (ref 0.4–1.5)
GFR: 93.19 mL/min (ref >60.00)
Glucose, Bld: 91 mg/dL (ref 70–99)
Potassium: 4.2 mEq/L (ref 3.5–5.1)
Sodium: 137 mEq/L (ref 135–145)
Total Bilirubin: 0.6 mg/dL (ref 0.3–1.2)
Total Protein: 7.2 g/dL (ref 6.0–8.3)

## 2013-02-28 LAB — LIPID PANEL
Cholesterol: 169 mg/dL (ref 0–200)
HDL: 50.1 mg/dL (ref >39.00)
LDL Cholesterol: 98 mg/dL (ref 0–99)
Total CHOL/HDL Ratio: 3
Triglycerides: 106 mg/dL (ref 0.0–149.0)
VLDL: 21.2 mg/dL (ref 0.0–40.0)

## 2013-03-05 ENCOUNTER — Encounter: Payer: Self-pay | Admitting: Family Medicine

## 2013-03-05 ENCOUNTER — Ambulatory Visit (INDEPENDENT_AMBULATORY_CARE_PROVIDER_SITE_OTHER): Payer: 59 | Admitting: Family Medicine

## 2013-03-05 VITALS — BP 130/84 | HR 84 | Temp 97.9°F | Ht 69.0 in | Wt 235.2 lb

## 2013-03-05 DIAGNOSIS — I10 Essential (primary) hypertension: Secondary | ICD-10-CM

## 2013-03-05 DIAGNOSIS — Z0000 Encounter for general adult medical examination without abnormal findings: Secondary | ICD-10-CM

## 2013-03-05 MED ORDER — METOPROLOL SUCCINATE ER 50 MG PO TB24: 50.0000 mg | ORAL_TABLET | Freq: Every day | ORAL | Status: DC

## 2013-03-05 NOTE — Assessment & Plan Note (Signed)
Routine anticipatory guidance given to patient.  See health maintenance. Colon and prostate cancer screening not indicated. Diet and exercise d/w pt.  He cut back on etoh and is working on cutting out dipping.   Living will encouraged. Wife designated if he is incapacitated.   Tetanus 2007 Flu shot d/w pt.  Encouraged.

## 2013-03-05 NOTE — Progress Notes (Signed)
CPE- See plan.  Routine anticipatory guidance given to patient.  See health maintenance. Colon and prostate cancer screening not indicated. Diet and exercise d/w pt.  He cut back on etoh and is working on cutting out dipping.   Living will encouraged. Wife designated if he is incapacitated.   Tetanus 2007 Flu shot d/w pt.  Encouraged.    Hypertension:    Using medication without problems or lightheadedness: yes Chest pain with exertion:no Edema:no Short of breath:no  PMH and SH reviewed  Meds, vitals, and allergies reviewed.   ROS: See HPI.  Otherwise negative.    GEN: nad, alert and oriented HEENT: mucous membranes moist NECK: supple w/o LA CV: rrr. PULM: ctab, no inc wob ABD: soft, +bs EXT: no edema SKIN: no acute rash

## 2013-03-05 NOTE — Patient Instructions (Addendum)
Recheck in about 1 year.  Don't change your meds.  Keep working on your weight.  Glad to see you.

## 2013-03-05 NOTE — Assessment & Plan Note (Signed)
D/w pt about diet and exercise. Continue current meds.

## 2013-11-23 ENCOUNTER — Other Ambulatory Visit: Payer: Self-pay

## 2013-11-23 MED ORDER — METOPROLOL SUCCINATE ER 50 MG PO TB24: 50.0000 mg | ORAL_TABLET | Freq: Every day | ORAL | Status: DC

## 2013-11-23 NOTE — Telephone Encounter (Signed)
Pt request refill metoprolol to [facility] and pt already has CPX 03/08/14. Advised pt done.

## 2014-02-24 ENCOUNTER — Other Ambulatory Visit: Payer: Self-pay | Admitting: Family Medicine

## 2014-02-24 DIAGNOSIS — I10 Essential (primary) hypertension: Secondary | ICD-10-CM

## 2014-02-28 ENCOUNTER — Other Ambulatory Visit (INDEPENDENT_AMBULATORY_CARE_PROVIDER_SITE_OTHER): Payer: BC Managed Care – PPO

## 2014-02-28 DIAGNOSIS — K760 Fatty (change of) liver, not elsewhere classified: Secondary | ICD-10-CM

## 2014-02-28 DIAGNOSIS — E7800 Pure hypercholesterolemia, unspecified: Secondary | ICD-10-CM

## 2014-02-28 DIAGNOSIS — K7689 Other specified diseases of liver: Secondary | ICD-10-CM

## 2014-02-28 DIAGNOSIS — I10 Essential (primary) hypertension: Secondary | ICD-10-CM

## 2014-02-28 DIAGNOSIS — Z0000 Encounter for general adult medical examination without abnormal findings: Secondary | ICD-10-CM

## 2014-02-28 LAB — COMPREHENSIVE METABOLIC PANEL
ALT: 32 U/L (ref 0–53)
AST: 28 U/L (ref 0–37)
Albumin: 4.2 g/dL (ref 3.5–5.2)
Alkaline Phosphatase: 71 U/L (ref 39–117)
BUN: 10 mg/dL (ref 6–23)
CO2: 29 mEq/L (ref 19–32)
Calcium: 9.4 mg/dL (ref 8.4–10.5)
Chloride: 101 mEq/L (ref 96–112)
Creatinine, Ser: 0.9 mg/dL (ref 0.4–1.5)
GFR: 93.94 mL/min (ref >60.00)
Glucose, Bld: 101 mg/dL — ABNORMAL HIGH (ref 70–99)
Potassium: 4.6 mEq/L (ref 3.5–5.1)
Sodium: 136 mEq/L (ref 135–145)
Total Bilirubin: 1.4 mg/dL — ABNORMAL HIGH (ref 0.2–1.2)
Total Protein: 7.2 g/dL (ref 6.0–8.3)

## 2014-02-28 LAB — LIPID PANEL
Cholesterol: 173 mg/dL (ref 0–200)
HDL: 54.5 mg/dL (ref >39.00)
LDL Cholesterol: 100 mg/dL — ABNORMAL HIGH (ref 0–99)
NonHDL: 118.5
Total CHOL/HDL Ratio: 3
Triglycerides: 95 mg/dL (ref 0.0–149.0)
VLDL: 19 mg/dL (ref 0.0–40.0)

## 2014-03-08 ENCOUNTER — Encounter (INDEPENDENT_AMBULATORY_CARE_PROVIDER_SITE_OTHER): Payer: Self-pay

## 2014-03-08 ENCOUNTER — Ambulatory Visit (INDEPENDENT_AMBULATORY_CARE_PROVIDER_SITE_OTHER): Payer: BC Managed Care – PPO | Admitting: Family Medicine

## 2014-03-08 ENCOUNTER — Encounter: Payer: Self-pay | Admitting: Family Medicine

## 2014-03-08 VITALS — BP 140/86 | HR 87 | Temp 98.1°F | Ht 68.5 in | Wt 238.2 lb

## 2014-03-08 DIAGNOSIS — I10 Essential (primary) hypertension: Secondary | ICD-10-CM

## 2014-03-08 DIAGNOSIS — Z0000 Encounter for general adult medical examination without abnormal findings: Secondary | ICD-10-CM

## 2014-03-08 MED ORDER — METOPROLOL SUCCINATE ER 50 MG PO TB24: 50.0000 mg | ORAL_TABLET | Freq: Every day | ORAL | Status: DC

## 2014-03-08 NOTE — Assessment & Plan Note (Signed)
Routine anticipatory guidance given to patient.  See health maintenance. Tetanus 2007.  PNA and shingles not due yet, d/w pt.   Flu shot encouraged.   Colon and prostate cancer screening not due.   Living will d/w pt.  Wife designated if patient were incapacitated.   Diet and exercise d/w pt.  Walking more, swimming for exercise.  Hyperglycemia d/w pt.  Sugar 101.

## 2014-03-08 NOTE — Assessment & Plan Note (Signed)
Recheck at goal, continue metoprolol.  D/w pt about sugar/labs, salt and weight/exercise/diet.  Recheck next year.  He agrees.  Cr wnl. Lipids okay, TC <200.

## 2014-03-08 NOTE — Progress Notes (Signed)
Pre visit review using our clinic review tool, if applicable. No additional management support is needed unless otherwise documented below in the visit note.  CPE- See plan.  Routine anticipatory guidance given to patient.  See health maintenance. Tetanus 2007.  PNA and shingles not due yet, d/w pt.   Flu shot encouraged.   Colon and prostate cancer screening not due.   Living will d/w pt.  Wife designated if patient were incapacitated.   Diet and exercise d/w pt.  Walking more, swimming for exercise.  Hyperglycemia d/w pt.  Sugar 101.    Hypertension:    Using medication without problems or lightheadedness: yes Chest pain with exertion:no Edema:no Short of breath:only from relative deconditioning Average home BPs: not checked.   D/w pt about trying to cut salt from diet, he's working on that.   Recheck BP at goal.   PMH and SH reviewed  Meds, vitals, and allergies reviewed.   ROS: See HPI.  Otherwise negative.    GEN: nad, alert and oriented HEENT: mucous membranes moist NECK: supple w/o LA CV: rrr. PULM: ctab, no inc wob ABD: soft, +bs EXT: no edema SKIN: no acute rash

## 2014-03-08 NOTE — Patient Instructions (Signed)
Take care.  Glad to see you.  Recheck in about 1 year.  Try to keep working on your weight and keep cutting back on salt.  I would get a flu shot each fall.   Don't change your meds.

## 2014-03-11 ENCOUNTER — Telehealth: Payer: Self-pay | Admitting: Family Medicine

## 2014-03-11 NOTE — Telephone Encounter (Signed)
Relevant patient education mailed to patient.  

## 2014-12-25 ENCOUNTER — Other Ambulatory Visit: Payer: Self-pay | Admitting: Family Medicine

## 2015-03-07 ENCOUNTER — Other Ambulatory Visit: Payer: Self-pay | Admitting: Family Medicine

## 2015-03-07 ENCOUNTER — Other Ambulatory Visit (INDEPENDENT_AMBULATORY_CARE_PROVIDER_SITE_OTHER): Payer: BLUE CROSS/BLUE SHIELD

## 2015-03-07 DIAGNOSIS — I10 Essential (primary) hypertension: Secondary | ICD-10-CM

## 2015-03-07 LAB — COMPREHENSIVE METABOLIC PANEL
ALT: 28 U/L (ref 0–53)
AST: 22 U/L (ref 0–37)
Albumin: 4.5 g/dL (ref 3.5–5.2)
Alkaline Phosphatase: 69 U/L (ref 39–117)
BUN: 12 mg/dL (ref 6–23)
CO2: 27 mEq/L (ref 19–32)
Calcium: 9.4 mg/dL (ref 8.4–10.5)
Chloride: 103 mEq/L (ref 96–112)
Creatinine, Ser: 0.85 mg/dL (ref 0.40–1.50)
GFR: 102.47 mL/min (ref >60.00)
Glucose, Bld: 105 mg/dL — ABNORMAL HIGH (ref 70–99)
Potassium: 4.5 mEq/L (ref 3.5–5.1)
Sodium: 139 mEq/L (ref 135–145)
Total Bilirubin: 0.9 mg/dL (ref 0.2–1.2)
Total Protein: 7.2 g/dL (ref 6.0–8.3)

## 2015-03-07 LAB — LIPID PANEL
Cholesterol: 177 mg/dL (ref 0–200)
HDL: 58.7 mg/dL (ref >39.00)
LDL Cholesterol: 103 mg/dL — ABNORMAL HIGH (ref 0–99)
NonHDL: 118.43
Total CHOL/HDL Ratio: 3
Triglycerides: 75 mg/dL (ref 0.0–149.0)
VLDL: 15 mg/dL (ref 0.0–40.0)

## 2015-03-14 ENCOUNTER — Ambulatory Visit (INDEPENDENT_AMBULATORY_CARE_PROVIDER_SITE_OTHER): Payer: BLUE CROSS/BLUE SHIELD | Admitting: Family Medicine

## 2015-03-14 ENCOUNTER — Encounter: Payer: Self-pay | Admitting: Family Medicine

## 2015-03-14 VITALS — BP 140/78 | HR 94 | Temp 98.1°F | Ht 68.5 in | Wt 237.0 lb

## 2015-03-14 DIAGNOSIS — Z0000 Encounter for general adult medical examination without abnormal findings: Secondary | ICD-10-CM

## 2015-03-14 DIAGNOSIS — E663 Overweight: Secondary | ICD-10-CM

## 2015-03-14 DIAGNOSIS — I10 Essential (primary) hypertension: Secondary | ICD-10-CM

## 2015-03-14 DIAGNOSIS — K760 Fatty (change of) liver, not elsewhere classified: Secondary | ICD-10-CM

## 2015-03-14 DIAGNOSIS — Z7189 Other specified counseling: Secondary | ICD-10-CM

## 2015-03-14 MED ORDER — METOPROLOL SUCCINATE ER 50 MG PO TB24: 50.0000 mg | ORAL_TABLET | Freq: Every day | ORAL | Status: DC

## 2015-03-14 NOTE — Assessment & Plan Note (Signed)
D/w pt about diet and exercise.   

## 2015-03-14 NOTE — Assessment & Plan Note (Signed)
Controlled, continue BB for now.  No change in dose.  D/w pt about diet and exercise.  If more lightheaded episodes, we can taper BB.  D/w pt.  He agrees.

## 2015-03-14 NOTE — Assessment & Plan Note (Addendum)
Routine anticipatory guidance given to patient. See health maintenance.  Tetanus 2007.  PNA and shingles not due yet, d/w pt.  Flu shot encouraged.  Colon and prostate cancer screening not due.  Living will d/w pt. Wife designated if patient were incapacitated.  Diet and exercise d/w pt, "fair, not the best, not the worst". Walking some, swimming for exercise. weight stable, encouraged loss.  Hyperglycemia d/w pt. Mild elevation, see above re: diet.  HIV testing d/w pt. Per patient, was neg on testing with outside labs ~2014.

## 2015-03-14 NOTE — Patient Instructions (Addendum)
I would get a flu shot each fall.   Keep working on your weight.  Let me know if I can help.  Take care.  Glad to see you.  Don't change your BP med for now.

## 2015-03-14 NOTE — Progress Notes (Signed)
Pre visit review using our clinic review tool, if applicable. No additional management support is needed unless otherwise documented below in the visit note.  CPE- See plan.  Routine anticipatory guidance given to patient.  See health maintenance. Tetanus 2007.  PNA and shingles not due yet, d/w pt. Flu shot encouraged.  Colon and prostate cancer screening not due.  Living will d/w pt. Wife designated if patient were incapacitated.  Diet and exercise d/w pt, "fair, not the best, not the worst". Walking some, swimming for exercise. weight stable, encouraged loss.   Hyperglycemia d/w pt.  Mild elevation,  see above re: diet.  HIV testing d/w pt.  Per patient, was neg on testing with outside labs ~2014.  Hypertension:    Using medication without problems or lightheadedness: yesusually- rarely lightheaded, no presyncope.   Chest pain with exertion:no Edema:no Short of breath:no  PMH and SH reviewed  Meds, vitals, and allergies reviewed.   ROS: See HPI.  Otherwise negative.    GEN: nad, alert and oriented HEENT: mucous membranes moist NECK: supple w/o LA CV: rrr. PULM: ctab, no inc wob ABD: soft, +bs EXT: no edema SKIN: no acute rash

## 2015-03-14 NOTE — Assessment & Plan Note (Signed)
Living will d/w pt.  Wife designated if patient were incapacitated.   ?

## 2015-03-17 ENCOUNTER — Encounter: Payer: Self-pay | Admitting: Family Medicine

## 2015-03-17 LAB — HIV ANTIBODY (ROUTINE TESTING W REFLEX): HIV: NEGATIVE

## 2015-03-27 ENCOUNTER — Ambulatory Visit (INDEPENDENT_AMBULATORY_CARE_PROVIDER_SITE_OTHER): Payer: BLUE CROSS/BLUE SHIELD | Admitting: Family Medicine

## 2015-03-27 ENCOUNTER — Encounter: Payer: Self-pay | Admitting: Family Medicine

## 2015-03-27 VITALS — BP 138/76 | HR 74 | Temp 98.9°F | Wt 239.0 lb

## 2015-03-27 DIAGNOSIS — R05 Cough: Secondary | ICD-10-CM

## 2015-03-27 DIAGNOSIS — R059 Cough, unspecified: Secondary | ICD-10-CM

## 2015-03-27 MED ORDER — HYDROCODONE-HOMATROPINE 5-1.5 MG/5ML PO SYRP: 5.0000 mL | ORAL_SOLUTION | Freq: Three times a day (TID) | ORAL | Status: DC | PRN

## 2015-03-27 MED ORDER — BENZONATATE 200 MG PO CAPS: 200.0000 mg | ORAL_CAPSULE | Freq: Three times a day (TID) | ORAL | Status: DC | PRN

## 2015-03-27 MED ORDER — DOXYCYCLINE HYCLATE 100 MG PO TABS: 100.0000 mg | ORAL_TABLET | Freq: Two times a day (BID) | ORAL | Status: DC

## 2015-03-27 NOTE — Patient Instructions (Signed)
Drink plenty of fluids, take tylenol as needed, and gargle with warm salt water for your throat.   Start tessalon for the cough.  If not better, then you can use hycodan.  Start doxy in a few days if not improving.  This should gradually improve and I think you'll likely not need the antibiotics.  Take care.  Let us know if you have other concerns.

## 2015-03-27 NOTE — Progress Notes (Signed)
Pre visit review using our clinic review tool, if applicable. No additional management support is needed unless otherwise documented below in the visit note.  Son was sick recently but recovered.  Pt has been sick 3-4 days.  Noted fatigue first, then felt hot, fever noted a few days ago.  Then had a cough.  Episodic cough, worse at night.  Some sputum, in AM, greenish tinted.  No vomiting, no diarrhea.  He doesn't feel awful but no well.  No ear pain.  No ST except from cough.  Taking tylenol/motrin in the meantime.  No cough meds yet.    Meds, vitals, and allergies reviewed.   ROS: See HPI.  Otherwise, noncontributory.  GEN: nad, alert and oriented HEENT: mucous membranes moist, tm w/o erythema, nasal exam w/o erythema, clear discharge noted,  OP with mild cobblestoning NECK: supple w/o LA CV: rrr.   PULM: ctab, no inc wob EXT: no edema

## 2015-03-28 DIAGNOSIS — R05 Cough: Secondary | ICD-10-CM | POA: Insufficient documentation

## 2015-03-28 DIAGNOSIS — R059 Cough, unspecified: Secondary | ICD-10-CM | POA: Insufficient documentation

## 2015-03-28 NOTE — Assessment & Plan Note (Signed)
Nontoxic.  Likely viral.  Start tessalon for the cough.  If not better, then can use hycodan.  Sedation caution.  Start doxy in a few days if not improving.   He agrees. Okay for outpatient f/u.  Supportive care o/w.

## 2015-08-01 ENCOUNTER — Encounter: Payer: Self-pay | Admitting: Emergency Medicine

## 2015-08-01 ENCOUNTER — Emergency Department: Payer: BLUE CROSS/BLUE SHIELD

## 2015-08-01 ENCOUNTER — Emergency Department
Admission: EM | Admit: 2015-08-01 | Discharge: 2015-08-02 | Disposition: A | Discharge status: Home / Self Care | Payer: BLUE CROSS/BLUE SHIELD | Attending: Emergency Medicine | Admitting: Emergency Medicine

## 2015-08-01 DIAGNOSIS — Y92009 Unspecified place in unspecified non-institutional (private) residence as the place of occurrence of the external cause: Secondary | ICD-10-CM | POA: Diagnosis not present

## 2015-08-01 DIAGNOSIS — Z792 Long term (current) use of antibiotics: Secondary | ICD-10-CM | POA: Diagnosis not present

## 2015-08-01 DIAGNOSIS — Y998 Other external cause status: Secondary | ICD-10-CM | POA: Diagnosis not present

## 2015-08-01 DIAGNOSIS — Z23 Encounter for immunization: Secondary | ICD-10-CM | POA: Insufficient documentation

## 2015-08-01 DIAGNOSIS — Z79899 Other long term (current) drug therapy: Secondary | ICD-10-CM | POA: Insufficient documentation

## 2015-08-01 DIAGNOSIS — S91119A Laceration without foreign body of unspecified toe without damage to nail, initial encounter: Secondary | ICD-10-CM

## 2015-08-01 DIAGNOSIS — Y9389 Activity, other specified: Secondary | ICD-10-CM | POA: Diagnosis not present

## 2015-08-01 DIAGNOSIS — I10 Essential (primary) hypertension: Secondary | ICD-10-CM | POA: Diagnosis not present

## 2015-08-01 DIAGNOSIS — W2209XA Striking against other stationary object, initial encounter: Secondary | ICD-10-CM | POA: Insufficient documentation

## 2015-08-01 DIAGNOSIS — S91112A Laceration without foreign body of left great toe without damage to nail, initial encounter: Secondary | ICD-10-CM | POA: Insufficient documentation

## 2015-08-01 NOTE — ED Notes (Signed)
Left foot big toe.

## 2015-08-01 NOTE — ED Notes (Signed)
Pt reports he was walking to lock the doors and " struck his toe on the rock". Pt reports stepping stones inside house. Pt is a/o and with NAD noted at this time.

## 2015-08-02 MED ORDER — CEPHALEXIN 500 MG PO CAPS: 500.0000 mg | ORAL_CAPSULE | Freq: Two times a day (BID) | ORAL | Status: DC

## 2015-08-02 MED ORDER — TETANUS-DIPHTH-ACELL PERTUSSIS 5-2.5-18.5 LF-MCG/0.5 IM SUSP
0.5000 mL | Freq: Once | INTRAMUSCULAR | Status: AC
  Administered 2015-08-02: 0.5 mL via INTRAMUSCULAR
  Filled 2015-08-02: qty 0.5

## 2015-08-02 MED ORDER — LIDOCAINE HCL (PF) 1 % IJ SOLN
INTRAMUSCULAR | Status: AC
  Filled 2015-08-02: qty 5

## 2015-08-02 NOTE — ED Provider Notes (Signed)
[facility] Emergency Department Provider Note  ____________________________________________  Time seen: On arrival  I have reviewed the triage vital signs and the nursing notes.   HISTORY  Chief Complaint Foot Injury    HPI Raymond Woods is a 50 y.o. male who presents with a left great toe laceration. Patient reports he struck his toe on a rock in his house. Bleeding controlled at this time. He does have full range of motion of his toe. No other injuries noted. He is unclear on his tetanus    Past Medical History  Diagnosis Date  . Hypertension   . Fatty liver     Patient Active Problem List   Diagnosis Date Noted  . Cough 03/28/2015  . Advance care planning 03/14/2015  . Fatty liver 03/03/2012  . Routine general medical examination at a health care facility 12/20/2010  . HYPERCHOLESTEROLEMIA 12/04/2009  . Overweight 09/02/2008  . SNORING 09/02/2008  . Essential hypertension 05/02/2007    Past Surgical History  Procedure Laterality Date  . Hernia repair      37 weeks old  . Esophagogastroduodenoscopy      bx negative stricture secondary reflux, Barretts neagtive 03/20/02, + H Pylori, ABD U/S negative, fatty liver, left kidney prom soft tissue-CT 01/27/2006, CT  abd/pelvis dromedary hump of kidney 02/03/06    Current Outpatient Rx  Name  Route  Sig  Dispense  Refill  . benzonatate (TESSALON) 200 MG capsule   Oral   Take 1 capsule (200 mg total) by mouth 3 (three) times daily as needed.   30 capsule   1   . calcium carbonate-magnesium hydroxide (ROLAIDS) 334 MG CHEW   Oral   Chew 1 tablet by mouth 2 (two) times daily with a meal.           . doxycycline (VIBRA-TABS) 100 MG tablet   Oral   Take 1 tablet (100 mg total) by mouth 2 (two) times daily.   20 tablet   0   . fish oil-omega-3 fatty acids 1000 MG capsule   Oral   Take 2 g by mouth daily.           Marland Kitchen HYDROcodone-homatropine (HYCODAN) 5-1.5 MG/5ML syrup   Oral  Take 5 mLs by mouth every 8 (eight) hours as needed for cough (sedation caution).   120 mL   0   . metoprolol succinate (TOPROL-XL) 50 MG 24 hr tablet   Oral   Take 1 tablet (50 mg total) by mouth daily.   90 tablet   3   . Multiple Vitamin (MULTIVITAMIN) tablet   Oral   Take 1 tablet by mouth daily.           . NON FORMULARY      Vitamin B-Active daily         . Probiotic Product (PROBIOTIC PO)   Oral   Take 1 capsule by mouth every other day.           . vitamin B-12 (CYANOCOBALAMIN) 1000 MCG tablet   Oral   Take 1,000 mcg by mouth. OTC 1 daily with Zinc          . vitamin C (ASCORBIC ACID) 500 MG tablet   Oral   Take 500 mg by mouth daily.             Allergies Review of patient's allergies indicates no known allergies.  Family History  Problem Relation Age of Onset  . Heart attack Father  age 70  . Coronary artery disease Father   . Hypertension Father   . COPD Father   . Asthma Father   . Heart disease Father   . Diverticulitis Brother     33  . Colitis Brother   . Coronary artery disease Paternal Grandfather   . Diabetes      cousins  . Stroke Paternal Grandmother   . Stroke Maternal Grandmother   . Hypertension Mother   . Prostate cancer Neg Hx   . Colon cancer Neg Hx     Social History Social History  Substance Use Topics  . Smoking status: Never Smoker   . Smokeless tobacco: Current User    Types: Chew  . Alcohol Use: 1.8 oz/week    3 Shots of liquor per week     Comment: weekends    Review of Systems  Constitutional: No dizziness     MS: Positive for toe pain  Skin: Positive for laceration Neurological: full ROM of toes   ____________________________________________   PHYSICAL EXAM:  VITAL SIGNS: ED Triage Vitals  Enc Vitals Group     BP 08/01/15 2245 134/69 mmHg     Pulse Rate 08/01/15 2245 87     Resp 08/01/15 2245 16     Temp 08/01/15 2245 97.9 F (36.6 C)     Temp Source 08/01/15 2245 Oral      SpO2 08/01/15 2245 96 %     Weight 08/01/15 2245 230 lb (104.327 kg)     Height 08/01/15 2245 5\' 9"  (1.753 m)     Head Cir --      Peak Flow --      Pain Score 08/01/15 2246 2     Pain Loc --      Pain Edu? --      Excl. in GC? --      Constitutional: Alert and oriented. Well appearing and in no distress. pleasant and interactive  Eyes: Conjunctivae are normal.  ENT   Head: Normocephalic and atraumatic.   Mouth/Throat: Mucous membranes are moist.  Respiratory: Normal respiratory effort without tachypnea nor retractions.    Neurologic:  Normal speech and language. No gross focal neurologic deficits are appreciated. Skin:  Skin is warm, dry. Approximately 3 severe laceration to the plantar surface proximal aspect of the left great toe bleeding controlled. Full range of motion of toe against resistance, no evidence of tendon injury .  Psychiatric: Mood and affect are normal. Patient exhibits appropriate insight and judgment.  ____________________________________________    LABS (pertinent positives/negatives)  Labs Reviewed - No data to display  ____________________________________________     ____________________________________________    RADIOLOGY I have personally reviewed any xrays that were ordered on this patient: No foreign body or bony injury  ____________________________________________   PROCEDURES  Procedure(s) performed: yes  LACERATION REPAIR Performed by: Jene Every Authorized by: Jene Every Consent: Verbal consent obtained. Risks and benefits: risks, benefits and alternatives were discussed Consent given by: patient Patient identity confirmed: provided demographic data Prepped and Draped in normal sterile fashion Wound explored  Laceration Location: Plantar surface left great toe  Laceration Length: 3 cm cm  No Foreign Bodies seen or palpated  Anesthesia: local infiltration  Local anesthetic: lidocaine 1 %   Anesthetic  total: 5 ml  Irrigation method: syringe Amount of cleaning: standard  Skin closure: Ethilon   Number of sutures: 5   Technique: Simple interrupted   Patient tolerance: Patient tolerated the procedure well with no immediate complications.  ____________________________________________   INITIAL IMPRESSION / ASSESSMENT AND PLAN / ED COURSE  Pertinent labs & imaging results that were available during my care of the patient were reviewed by me and considered in my medical decision making (see chart for details).  Patient with laceration to the left plantar surface of the great toe. X-rays unremarkable. Laceration repaired. Suture removal in 7 days. We'll place on prophylactic antibiotics given location. Toe splinted by tech  ____________________________________________   FINAL CLINICAL IMPRESSION(S) / ED DIAGNOSES  Final diagnoses:  Toe laceration, initial encounter     Jene Everyobert Maddex Garlitz, MD 08/02/15 [phone]

## 2015-08-02 NOTE — Discharge Instructions (Signed)
Laceration Care, Adult  A laceration is a cut that goes through all layers of the skin. The cut also goes into the tissue that is right under the skin. Some cuts heal on their own. Others need to be closed with stitches (sutures), staples, skin adhesive strips, or wound glue. Taking care of your cut lowers your risk of infection and helps your cut to heal better.  HOW TO TAKE CARE OF YOUR CUT  For stitches or staples:  · Keep the wound clean and dry.  · If you were given a bandage (dressing), you should change it at least one time per day or as told by your doctor. You should also change it if it gets wet or dirty.  · Keep the wound completely dry for the first 24 hours or as told by your doctor. After that time, you may take a shower or a bath. However, make sure that the wound is not soaked in water until after the stitches or staples have been removed.  · Clean the wound one time each day or as told by your doctor:    Wash the wound with soap and water.    Rinse the wound with water until all of the soap comes off.    Pat the wound dry with a clean towel. Do not rub the wound.  · After you clean the wound, put a thin layer of antibiotic ointment on it as told by your doctor. This ointment:    Helps to prevent infection.    Keeps the bandage from sticking to the wound.  · Have your stitches or staples removed as told by your doctor.  If your doctor used skin adhesive strips:   · Keep the wound clean and dry.  · If you were given a bandage, you should change it at least one time per day or as told by your doctor. You should also change it if it gets dirty or wet.  · Do not get the skin adhesive strips wet. You can take a shower or a bath, but be careful to keep the wound dry.  · If the wound gets wet, pat it dry with a clean towel. Do not rub the wound.  · Skin adhesive strips fall off on their own. You can trim the strips as the wound heals. Do not remove any strips that are still stuck to the wound. They will  fall off after a while.  If your doctor used wound glue:  · Try to keep your wound dry, but you may briefly wet it in the shower or bath. Do not soak the wound in water, such as by swimming.  · After you take a shower or a bath, gently pat the wound dry with a clean towel. Do not rub the wound.  · Do not do any activities that will make you really sweaty until the skin glue has fallen off on its own.  · Do not apply liquid, cream, or ointment medicine to your wound while the skin glue is still on.  · If you were given a bandage, you should change it at least one time per day or as told by your doctor. You should also change it if it gets dirty or wet.  · If a bandage is placed over the wound, do not let the tape for the bandage touch the skin glue.  · Do not pick at the glue. The skin glue usually stays on for 5-10 days. Then, it   falls off of the skin.  General Instructions   · To help prevent scarring, make sure to cover your wound with sunscreen whenever you are outside after stitches are removed, after adhesive strips are removed, or when wound glue stays in place and the wound is healed. Make sure to wear a sunscreen of at least 30 SPF.  · Take over-the-counter and prescription medicines only as told by your doctor.  · If you were given antibiotic medicine or ointment, take or apply it as told by your doctor. Do not stop using the antibiotic even if your wound is getting better.  · Do not scratch or pick at the wound.  · Keep all follow-up visits as told by your doctor. This is important.  · Check your wound every day for signs of infection. Watch for:    Redness, swelling, or pain.    Fluid, blood, or pus.  · Raise (elevate) the injured area above the level of your heart while you are sitting or lying down, if possible.  GET HELP IF:  · You got a tetanus shot and you have any of these problems at the injection site:    Swelling.    Very bad pain.    Redness.    Bleeding.  · You have a fever.  · A wound that was  closed breaks open.  · You notice a bad smell coming from your wound or your bandage.  · You notice something coming out of the wound, such as wood or glass.  · Medicine does not help your pain.  · You have more redness, swelling, or pain at the site of your wound.  · You have fluid, blood, or pus coming from your wound.  · You notice a change in the color of your skin near your wound.  · You need to change the bandage often because fluid, blood, or pus is coming from the wound.  · You start to have a new rash.  · You start to have numbness around the wound.  GET HELP RIGHT AWAY IF:  · You have very bad swelling around the wound.  · Your pain suddenly gets worse and is very bad.  · You notice painful lumps near the wound or on skin that is anywhere on your body.  · You have a red streak going away from your wound.  · The wound is on your hand or foot and you cannot move a finger or toe like you usually can.  · The wound is on your hand or foot and you notice that your fingers or toes look pale or bluish.     This information is not intended to replace advice given to you by your health care provider. Make sure you discuss any questions you have with your health care provider.     Document Released: 12/22/2007 Document Revised: 11/19/2014 Document Reviewed: 07/01/2014  Elsevier Interactive Patient Education ©2016 [employer]

## 2015-08-02 NOTE — ED Notes (Signed)
Pt discharged to home.  Family member driving.  Discharge instructions reviewed.  Verbalized understanding.  No questions or concerns at this time.  Teach back verified.  Pt in NAD.  No items left in ED.   

## 2015-08-08 ENCOUNTER — Encounter: Payer: Self-pay | Admitting: Family Medicine

## 2015-08-08 ENCOUNTER — Ambulatory Visit (INDEPENDENT_AMBULATORY_CARE_PROVIDER_SITE_OTHER): Payer: BLUE CROSS/BLUE SHIELD | Admitting: Family Medicine

## 2015-08-08 VITALS — BP 132/76 | HR 74 | Temp 98.4°F | Wt 243.0 lb

## 2015-08-08 DIAGNOSIS — Z4802 Encounter for removal of sutures: Secondary | ICD-10-CM

## 2015-08-08 NOTE — Patient Instructions (Signed)
Keep the area clean and covered.  Take care.  This should heal up.

## 2015-08-08 NOTE — Progress Notes (Signed)
Pre visit review using our clinic review tool, if applicable. No additional management support is needed unless otherwise documented below in the visit note.  ER f/u suture removal.  Cut toe 1 week ago.  Xray neg at ER, suture done at ER.  No complications at ER.    Meds, vitals, and allergies reviewed.   ROS: See HPI.  Otherwise, noncontributory.  nad L toe with healing lac on the L 1st toe, plantar side.   Sutures removed w/o complication.  steristrips applied.  Tissue still well apposed.

## 2015-08-08 NOTE — Assessment & Plan Note (Signed)
No complications with removeal.  Tolerated well.  Routine cautions given to patient. Finish abx and f/u prn.  The steristrips should wear off in a few days.

## 2016-03-08 ENCOUNTER — Other Ambulatory Visit: Payer: Self-pay | Admitting: Family Medicine

## 2016-03-08 DIAGNOSIS — I10 Essential (primary) hypertension: Secondary | ICD-10-CM

## 2016-03-12 ENCOUNTER — Other Ambulatory Visit (INDEPENDENT_AMBULATORY_CARE_PROVIDER_SITE_OTHER): Payer: BLUE CROSS/BLUE SHIELD

## 2016-03-12 DIAGNOSIS — I10 Essential (primary) hypertension: Secondary | ICD-10-CM | POA: Diagnosis not present

## 2016-03-12 LAB — LIPID PANEL
Cholesterol: 183 mg/dL (ref 0–200)
HDL: 57.1 mg/dL (ref >39.00)
LDL Cholesterol: 102 mg/dL — ABNORMAL HIGH (ref 0–99)
NonHDL: 125.9
Total CHOL/HDL Ratio: 3
Triglycerides: 121 mg/dL (ref 0.0–149.0)
VLDL: 24.2 mg/dL (ref 0.0–40.0)

## 2016-03-12 LAB — COMPREHENSIVE METABOLIC PANEL
ALT: 23 U/L (ref 0–53)
AST: 19 U/L (ref 0–37)
Albumin: 4.3 g/dL (ref 3.5–5.2)
Alkaline Phosphatase: 67 U/L (ref 39–117)
BUN: 10 mg/dL (ref 6–23)
CO2: 27 mEq/L (ref 19–32)
Calcium: 8.6 mg/dL (ref 8.4–10.5)
Chloride: 103 mEq/L (ref 96–112)
Creatinine, Ser: 0.97 mg/dL (ref 0.40–1.50)
GFR: 87.61 mL/min (ref >60.00)
Glucose, Bld: 91 mg/dL (ref 70–99)
Potassium: 4.1 mEq/L (ref 3.5–5.1)
Sodium: 138 mEq/L (ref 135–145)
Total Bilirubin: 0.7 mg/dL (ref 0.2–1.2)
Total Protein: 6.9 g/dL (ref 6.0–8.3)

## 2016-03-19 ENCOUNTER — Ambulatory Visit (INDEPENDENT_AMBULATORY_CARE_PROVIDER_SITE_OTHER): Payer: BLUE CROSS/BLUE SHIELD | Admitting: Family Medicine

## 2016-03-19 ENCOUNTER — Encounter: Payer: Self-pay | Admitting: Family Medicine

## 2016-03-19 VITALS — BP 136/80 | HR 75 | Temp 98.5°F | Wt 238.5 lb

## 2016-03-19 DIAGNOSIS — I10 Essential (primary) hypertension: Secondary | ICD-10-CM

## 2016-03-19 DIAGNOSIS — Z7189 Other specified counseling: Secondary | ICD-10-CM

## 2016-03-19 DIAGNOSIS — Z0000 Encounter for general adult medical examination without abnormal findings: Secondary | ICD-10-CM

## 2016-03-19 MED ORDER — METOPROLOL SUCCINATE ER 50 MG PO TB24: 50.0000 mg | ORAL_TABLET | Freq: Every day | ORAL | 3 refills | Status: DC

## 2016-03-19 NOTE — Assessment & Plan Note (Signed)
Living will d/w pt.  Wife designated if patient were incapacitated.   ?

## 2016-03-19 NOTE — Progress Notes (Signed)
CPE- See plan.  Routine anticipatory guidance given to patient.  See health maintenance. Tetanus 2017.  PNA and shingles not due yet, d/w pt.  Flu shot encouraged.  Declined by patient.  Colon and prostate cancer screening not due.  Living will d/w pt. Wife designated if patient were incapacitated.  Diet and exercise d/w pt, "fair, not the best, not the worst". Walking some, swimming for exercise. weight stable, encouraged loss.  D/w pt.   Hyperglycemia d/w pt. Mild elevation, see above re: diet.  HIV testing d/w pt. Per patient, was neg on testing with outside labs ~2014.  Hypertension:    Using medication without problems or lightheadedness: yes Chest pain with exertion:no Edema:no Short of breath:no  PMH and SH reviewed  Meds, vitals, and allergies reviewed.   ROS: Per HPI.  Unless specifically indicated otherwise in HPI, the patient denies:  General: fever. Eyes: acute vision changes ENT: sore throat Cardiovascular: chest pain Respiratory: SOB GI: vomiting GU: dysuria Musculoskeletal: acute back pain Derm: acute rash Neuro: acute motor dysfunction Psych: worsening mood Endocrine: polydipsia Heme: bleeding Allergy: hayfever  GEN: nad, alert and oriented HEENT: mucous membranes moist NECK: supple w/o LA CV: rrr. PULM: ctab, no inc wob ABD: soft, +bs EXT: no edema

## 2016-03-19 NOTE — Progress Notes (Signed)
Pre visit review using our clinic review tool, if applicable. No additional management support is needed unless otherwise documented below in the visit note. 

## 2016-03-19 NOTE — Assessment & Plan Note (Signed)
Controlled, d/w pt about labs, labs unremarkable.  Needs work on diet and exercise.  No change in med.  BP controlled.  No ADE on med.

## 2016-03-19 NOTE — Patient Instructions (Signed)
Take care.  Glad to see you.  

## 2016-03-19 NOTE — Assessment & Plan Note (Addendum)
Tetanus 2017.  PNA and shingles not due yet, d/w pt.  Flu shot encouraged. Declined by patient.  Colon and prostate cancer screening not due.  Living will d/w pt. Wife designated if patient were incapacitated.  Diet and exercise d/w pt, "fair, not the best, not the worst". Walking some, swimming for exercise. weight stable, encouraged loss.  D/w pt.   Hyperglycemia d/w pt. Mild elevation, see above re: diet.  HIV testing d/w pt. Per patient, was neg on testing with outside labs ~2014.

## 2017-03-13 ENCOUNTER — Other Ambulatory Visit: Payer: Self-pay | Admitting: Family Medicine

## 2017-03-13 DIAGNOSIS — I10 Essential (primary) hypertension: Secondary | ICD-10-CM

## 2017-03-18 ENCOUNTER — Other Ambulatory Visit (INDEPENDENT_AMBULATORY_CARE_PROVIDER_SITE_OTHER): Payer: BLUE CROSS/BLUE SHIELD

## 2017-03-18 DIAGNOSIS — I10 Essential (primary) hypertension: Secondary | ICD-10-CM

## 2017-03-18 LAB — COMPREHENSIVE METABOLIC PANEL
ALT: 24 U/L (ref 0–53)
AST: 21 U/L (ref 0–37)
Albumin: 4.4 g/dL (ref 3.5–5.2)
Alkaline Phosphatase: 65 U/L (ref 39–117)
BUN: 8 mg/dL (ref 6–23)
CO2: 26 mEq/L (ref 19–32)
Calcium: 9.2 mg/dL (ref 8.4–10.5)
Chloride: 102 mEq/L (ref 96–112)
Creatinine, Ser: 0.85 mg/dL (ref 0.40–1.50)
GFR: 101.6 mL/min (ref >60.00)
Glucose, Bld: 92 mg/dL (ref 70–99)
Potassium: 4.1 mEq/L (ref 3.5–5.1)
Sodium: 137 mEq/L (ref 135–145)
Total Bilirubin: 0.7 mg/dL (ref 0.2–1.2)
Total Protein: 6.8 g/dL (ref 6.0–8.3)

## 2017-03-18 LAB — LIPID PANEL
Cholesterol: 165 mg/dL (ref 0–200)
HDL: 49.9 mg/dL (ref >39.00)
LDL Cholesterol: 92 mg/dL (ref 0–99)
NonHDL: 115.58
Total CHOL/HDL Ratio: 3
Triglycerides: 117 mg/dL (ref 0.0–149.0)
VLDL: 23.4 mg/dL (ref 0.0–40.0)

## 2017-03-25 ENCOUNTER — Ambulatory Visit (INDEPENDENT_AMBULATORY_CARE_PROVIDER_SITE_OTHER): Payer: BLUE CROSS/BLUE SHIELD | Admitting: Family Medicine

## 2017-03-25 ENCOUNTER — Encounter: Payer: Self-pay | Admitting: Family Medicine

## 2017-03-25 VITALS — BP 126/94 | HR 72 | Temp 98.8°F | Ht 69.0 in | Wt 234.2 lb

## 2017-03-25 DIAGNOSIS — K760 Fatty (change of) liver, not elsewhere classified: Secondary | ICD-10-CM

## 2017-03-25 DIAGNOSIS — Z0000 Encounter for general adult medical examination without abnormal findings: Secondary | ICD-10-CM

## 2017-03-25 DIAGNOSIS — Z7189 Other specified counseling: Secondary | ICD-10-CM

## 2017-03-25 DIAGNOSIS — I10 Essential (primary) hypertension: Secondary | ICD-10-CM

## 2017-03-25 MED ORDER — METOPROLOL SUCCINATE ER 50 MG PO TB24: 50.0000 mg | ORAL_TABLET | Freq: Every day | ORAL | 3 refills | Status: DC

## 2017-03-25 NOTE — Assessment & Plan Note (Signed)
H/o, weight loss noted.  Recheck LFTs wnl.  Continue work on diet and exercise.

## 2017-03-25 NOTE — Progress Notes (Signed)
CPE- See plan.  Routine anticipatory guidance given to patient.  See health maintenance.  The possibility exists that previously documented standard health maintenance information may have been brought forward from a previous encounter into this note.  If needed, that same information has been updated to reflect the current situation based on today's encounter.    Tetanus 2017.  PNA and shingles not due yet, d/w pt.  Flu shot encouraged. Declined by patient. Colon and prostate cancer screening not due.  Living will d/w pt. Wife designated if patient were incapacitated.  Diet and exercise d/w pt, "fair, not the best, not the worst". Walking some, swimming for exercise. D/w pt.   H/o hyperglycemia d/w pt. Mild elevation prev, improved 2018.   HIV testing d/w pt. Per patient, was neg on testing with outside labs ~2014. D/w pt about dip tobacco, encouraged cessation.    HTN.  Had been off/on metoprolol.  No ADE on med prev.  He got out of the habit of taking med and stopped.  He has lost some weight in the meantime, intentionally. No CP, not SOB. His father is inpatient currently, in ICU, d/w pt. It is worth checking his BP when his situation is relative calm.  He may not be consistently hypertensive to the point of needing treatment at this point, d/w pt.  He is admittedly in a high stress situation currently and his systolic is only 161126 off med.   PMH and SH reviewed  Meds, vitals, and allergies reviewed.   ROS: Per HPI.  Unless specifically indicated otherwise in HPI, the patient denies:  General: fever. Eyes: acute vision changes ENT: sore throat Cardiovascular: chest pain Respiratory: SOB GI: vomiting GU: dysuria Musculoskeletal: acute back pain Derm: acute rash Neuro: acute motor dysfunction Psych: worsening mood Endocrine: polydipsia Heme: bleeding Allergy: hayfever  GEN: nad, alert and oriented HEENT: mucous membranes moist NECK: supple w/o LA CV: rrr. PULM: ctab, no inc  wob ABD: soft, +bs EXT: no edema SKIN: no acute rash

## 2017-03-25 NOTE — Patient Instructions (Addendum)
It is worth checking your BP when your situation is relatively calm.  If consistently >140/>90 then restart the medicine.  Take care.  Glad to see you.

## 2017-03-25 NOTE — Assessment & Plan Note (Signed)
Living will d/w pt.  Wife designated if patient were incapacitated.   ?

## 2017-03-25 NOTE — Assessment & Plan Note (Signed)
Tetanus 2017.  PNA and shingles not due yet, d/w pt.  Flu shot encouraged. Declined by patient. Colon and prostate cancer screening not due.  Living will d/w pt. Wife designated if patient were incapacitated.  Diet and exercise d/w pt, "fair, not the best, not the worst". Walking some, swimming for exercise. D/w pt.   H/o hyperglycemia d/w pt. Mild elevation prev, improved 2018.   HIV testing d/w pt. Per patient, was neg on testing with outside labs ~2014. D/w pt about dip tobacco, encouraged cessation.

## 2017-03-25 NOTE — Assessment & Plan Note (Signed)
Continue work on diet and exercise.   Off med.  He has lost some weight in the meantime, intentionally. No CP, not SOB. His father is inpatient currently, in ICU, d/w pt. It is worth checking his BP when his situation is relative calm.  He may not be consistently hypertensive to the point of needing treatment at this point, d/w pt.  He is admittedly in a high stress situation currently and his systolic is only 161126 off med.  Would stay off metoprolol for now.  He agrees.   rx printed for patient to hold and use if BP is consistently up.  See AVS.

## 2017-09-27 ENCOUNTER — Other Ambulatory Visit: Payer: Self-pay | Admitting: Family Medicine

## 2017-09-27 NOTE — Telephone Encounter (Signed)
Copied from CRM [id number]. Topic: Quick Communication - Rx Refill/Question >> Sep 27, 2017  2:53 PM Gloriann LoanPayne, Gwenda Heiner L wrote: Medication: metoprolol succinate (TOPROL-XL) 50 MG 24 hr tablet   Has the patient contacted their pharmacy? Yes.     (Agent: If no, request that the patient contact the pharmacy for the refill.)   Preferred Pharmacy (with phone number or street name): [id number] - [place], [place] - [address] [phone] (Phone) [phone] (Fax)     Agent: Please be advised that RX refills may take up to 3 business days. We ask that you follow-up with your pharmacy.

## 2017-09-28 MED ORDER — METOPROLOL SUCCINATE ER 50 MG PO TB24: 50.0000 mg | ORAL_TABLET | Freq: Every day | ORAL | 3 refills | Status: DC

## 2017-09-28 NOTE — Telephone Encounter (Signed)
See last office visit notes. 

## 2017-09-28 NOTE — Telephone Encounter (Signed)
Prescription sent (I initially printed a copy of the prescription in error, please shred that).  Have patient update us if his blood pressure is not controlled when on the medicine (<140/<90).  Thanks.

## 2017-09-28 NOTE — Telephone Encounter (Signed)
Patient notified as instructed by telephone and verbalized understanding. 

## 2017-09-28 NOTE — Telephone Encounter (Deleted)
Request refill for metoprolol succinate.

## 2017-09-28 NOTE — Telephone Encounter (Signed)
Request for metoprolol succinate.

## 2018-03-27 ENCOUNTER — Encounter: Payer: Self-pay | Admitting: Family Medicine

## 2018-03-27 ENCOUNTER — Other Ambulatory Visit: Payer: Self-pay | Admitting: Family Medicine

## 2018-03-27 DIAGNOSIS — I10 Essential (primary) hypertension: Secondary | ICD-10-CM

## 2018-03-31 ENCOUNTER — Other Ambulatory Visit (INDEPENDENT_AMBULATORY_CARE_PROVIDER_SITE_OTHER): Payer: BLUE CROSS/BLUE SHIELD

## 2018-03-31 DIAGNOSIS — I10 Essential (primary) hypertension: Secondary | ICD-10-CM

## 2018-03-31 LAB — LIPID PANEL
Cholesterol: 168 mg/dL (ref 0–200)
HDL: 53.7 mg/dL (ref >39.00)
LDL Cholesterol: 88 mg/dL (ref 0–99)
NonHDL: 113.87
Total CHOL/HDL Ratio: 3
Triglycerides: 130 mg/dL (ref 0.0–149.0)
VLDL: 26 mg/dL (ref 0.0–40.0)

## 2018-03-31 LAB — COMPREHENSIVE METABOLIC PANEL
ALT: 24 U/L (ref 0–53)
AST: 17 U/L (ref 0–37)
Albumin: 4.3 g/dL (ref 3.5–5.2)
Alkaline Phosphatase: 59 U/L (ref 39–117)
BUN: 10 mg/dL (ref 6–23)
CO2: 23 mEq/L (ref 19–32)
Calcium: 8.9 mg/dL (ref 8.4–10.5)
Chloride: 105 mEq/L (ref 96–112)
Creatinine, Ser: 0.91 mg/dL (ref 0.40–1.50)
GFR: 93.52 mL/min (ref >60.00)
Glucose, Bld: 95 mg/dL (ref 70–99)
Potassium: 4.4 mEq/L (ref 3.5–5.1)
Sodium: 140 mEq/L (ref 135–145)
Total Bilirubin: 0.6 mg/dL (ref 0.2–1.2)
Total Protein: 7 g/dL (ref 6.0–8.3)

## 2018-04-07 ENCOUNTER — Ambulatory Visit (INDEPENDENT_AMBULATORY_CARE_PROVIDER_SITE_OTHER): Payer: BLUE CROSS/BLUE SHIELD | Admitting: Family Medicine

## 2018-04-07 ENCOUNTER — Encounter: Payer: Self-pay | Admitting: Family Medicine

## 2018-04-07 VITALS — BP 132/84 | HR 77 | Temp 98.0°F | Ht 69.0 in | Wt 249.2 lb

## 2018-04-07 DIAGNOSIS — Z8249 Family history of ischemic heart disease and other diseases of the circulatory system: Secondary | ICD-10-CM

## 2018-04-07 DIAGNOSIS — I10 Essential (primary) hypertension: Secondary | ICD-10-CM

## 2018-04-07 DIAGNOSIS — Z0000 Encounter for general adult medical examination without abnormal findings: Secondary | ICD-10-CM | POA: Diagnosis not present

## 2018-04-07 DIAGNOSIS — Z7189 Other specified counseling: Secondary | ICD-10-CM

## 2018-04-07 MED ORDER — METOPROLOL SUCCINATE ER 50 MG PO TB24: 50.0000 mg | ORAL_TABLET | Freq: Every day | ORAL | 3 refills | Status: DC

## 2018-04-07 NOTE — Progress Notes (Signed)
CPE- See plan.  Routine anticipatory guidance given to patient.  See health maintenance.  The possibility exists that previously documented standard health maintenance information may have been brought forward from a previous encounter into this note.  If needed, that same information has been updated to reflect the current situation based on today's encounter.    Tetanus 2017.  PNA and shingles not due yet, d/w pt.  Flu shot encouraged. Declined by patient. Prostate cancer screening and PSA options (with potential risks and benefits of testing vs not testing) were discussed along with recent recs/guidelines.  He declined testing PSA at this point. D/w patient ZO:XWRUEAVre:options for colon cancer screening, including IFOB vs. colonoscopy.  Risks and benefits of both were discussed and patient voiced understanding.  Pt elects for cologuard.  See avs.   Living will d/w pt. Wife designated if patient were incapacitated.  Diet and exercise d/w pt, affected by his work situation.   H/o hyperglycemia d/w pt. Labs d/w pt.   HIV testing d/w pt. Per patient, was neg on testing with outside labs ~2014. D/w pt about dip tobacco, encouraged cessation.  he is considering options.  D/w pt about gum.  He has used in the past.    He is trying settle his father's estate and business affairs.  It is a profound hassle for patient and he is trying to compensate and deal with the situation. He'll update me as needed.   He agrees with that plan.   Hypertension:    Using medication without problems or lightheadedness: yes Chest pain with exertion:no Edema:no Short of breath:no Labs d/w pt.    We talked about his FH CAD.  Refer to cards.  Father had CAD in 1850s.    PMH and SH reviewed Meds, vitals, and allergies reviewed.   ROS: Per HPI.  Unless specifically indicated otherwise in HPI, the patient denies:  General: fever. Eyes: acute vision changes ENT: sore throat Cardiovascular: chest pain Respiratory: SOB GI:  vomiting GU: dysuria Musculoskeletal: acute back pain Derm: acute rash Neuro: acute motor dysfunction Psych: worsening mood Endocrine: polydipsia Heme: bleeding Allergy: hayfever  GEN: nad, alert and oriented HEENT: mucous membranes moist NECK: supple w/o LA CV: rrr. PULM: ctab, no inc wob ABD: soft, +bs EXT: no edema SKIN: no acute rash

## 2018-04-07 NOTE — Patient Instructions (Addendum)
I would get a flu shot each fall.   Check to see if cologuard is covered by your insurance and let me know either way.   We will call about your referral.  Shirlee LimerickMarion or Alvina Chounastasiya will call you if you don't see one of them on the way out.  Try to work on diet and exercise as best you can.  Take care.  Glad to see you.

## 2018-04-09 DIAGNOSIS — Z8249 Family history of ischemic heart disease and other diseases of the circulatory system: Secondary | ICD-10-CM | POA: Insufficient documentation

## 2018-04-09 NOTE — Assessment & Plan Note (Signed)
Tetanus 2017.  PNA and shingles not due yet, d/w pt.  Flu shot encouraged. Declined by patient. Prostate cancer screening and PSA options (with potential risks and benefits of testing vs not testing) were discussed along with recent recs/guidelines.  He declined testing PSA at this point. D/w patient ZO:XWRUEAVre:options for colon cancer screening, including IFOB vs. colonoscopy.  Risks and benefits of both were discussed and patient voiced understanding.  Pt elects for cologuard.  See avs.   Living will d/w pt. Wife designated if patient were incapacitated.  Diet and exercise d/w pt, affected by his work situation.   H/o hyperglycemia d/w pt. Labs d/w pt.   HIV testing d/w pt. Per patient, was neg on testing with outside labs ~2014. D/w pt about dip tobacco, encouraged cessation.  he is considering options.  D/w pt about gum.  He has used in the past.

## 2018-04-09 NOTE — Assessment & Plan Note (Signed)
Living will d/w pt.  Wife designated if patient were incapacitated.   ?

## 2018-04-09 NOTE — Assessment & Plan Note (Signed)
Refer to cards. 

## 2018-04-09 NOTE — Assessment & Plan Note (Signed)
Needs work on diet and exercise.  No change in meds at this point.  Needs weight loss with healthy diet and exercise.

## 2018-06-02 ENCOUNTER — Encounter: Payer: Self-pay | Admitting: Internal Medicine

## 2018-06-02 ENCOUNTER — Ambulatory Visit: Payer: BLUE CROSS/BLUE SHIELD | Admitting: Internal Medicine

## 2018-06-02 VITALS — BP 162/90 | HR 83 | Ht 69.0 in | Wt 252.0 lb

## 2018-06-02 DIAGNOSIS — I10 Essential (primary) hypertension: Secondary | ICD-10-CM

## 2018-06-02 DIAGNOSIS — Z7189 Other specified counseling: Secondary | ICD-10-CM | POA: Diagnosis not present

## 2018-06-02 NOTE — Progress Notes (Signed)
New Outpatient Visit Date: 06/02/2018  Referring Provider: Joaquim Nam, MD [address] [place], [place] [address]  Chief Complaint: Cardiac risk assessment  HPI:  Mr. Otterson is a 52 y.o. male who is being seen today for the evaluation of cardiac risk with a history of family history of coronary artery disease at the request of Dr. Para March. He has a history of hypertension and fatty liver.  His father developed coronary artery disease in his late 12's.  Mr. Mcmurtrey denies a personal history of cardiac disease.  He has not experienced chest pain, shortness of breath, palpitations, lightheadedness, orthopnea, PND, and edema.  Mr. Mcdanel endorses snoring and occasional daytime somnolence.  He has never been evaluated for sleep apnea.  His blood pressure is typically controlled with metoprolol.  He walks 20 minutes 2x/week.  --------------------------------------------------------------------------------------------------  Cardiovascular History & Procedures: Cardiovascular Problems:  Family history of coronary artery disease  Risk Factors:  Hypertension, male gender, and obesity  Cath/PCI:  None  CV Surgery:  None  EP Procedures and Devices:  None  Non-Invasive Evaluation(s):  None  Recent CV Pertinent Labs: Lab Results  Component Value Date   CHOL 168 03/31/2018   HDL 53.70 03/31/2018   LDLCALC 88 03/31/2018   TRIG 130.0 03/31/2018   CHOLHDL 3 03/31/2018   K 4.4 03/31/2018   BUN 10 03/31/2018   CREATININE 0.91 03/31/2018    --------------------------------------------------------------------------------------------------  Past Medical History:  Diagnosis Date  . Fatty liver   . Hypertension     Past Surgical History:  Procedure Laterality Date  . ESOPHAGOGASTRODUODENOSCOPY     bx negative stricture secondary reflux, Barretts neagtive 03/20/02, + H Pylori, ABD U/S negative, fatty liver, left kidney prom soft tissue-CT 01/27/2006,  CT  abd/pelvis dromedary hump of kidney 02/03/06  . HERNIA REPAIR     73 weeks old    Current Meds  Medication Sig  . calcium carbonate-magnesium hydroxide (ROLAIDS) 334 MG CHEW Chew 1 tablet by mouth 2 (two) times daily with a meal.    . fish oil-omega-3 fatty acids 1000 MG capsule Take 2 g by mouth daily.    . metoprolol succinate (TOPROL-XL) 50 MG 24 hr tablet Take 1 tablet (50 mg total) by mouth daily.  . Multiple Vitamin (MULTIVITAMIN) tablet Take 1 tablet by mouth daily.    . NON FORMULARY Vitamin B-Active daily  . Probiotic Product (PROBIOTIC PO) Take 1 capsule by mouth every other day.    . vitamin B-12 (CYANOCOBALAMIN) 1000 MCG tablet Take 1,000 mcg by mouth. OTC 1 daily with Zinc   . vitamin C (ASCORBIC ACID) 500 MG tablet Take 500 mg by mouth daily.      Allergies: Patient has no known allergies.  Social History   Tobacco Use  . Smoking status: Never Smoker  . Smokeless tobacco: Current User    Types: Chew  Substance Use Topics  . Alcohol use: Yes    Comment: fifth of liquor per week.  . Drug use: No    Family History  Problem Relation Age of Onset  . Heart attack Father 28  . Coronary artery disease Father   . Hypertension Father   . COPD Father   . Asthma Father   . Heart disease Father   . Diverticulitis Brother        82  . Colitis Brother   . Hypertension Mother   . Coronary artery disease Paternal Grandfather   . Diabetes Unknown  cousins  . Stroke Paternal Grandmother   . Stroke Maternal Grandmother   . Prostate cancer Neg Hx   . Colon cancer Neg Hx     Review of Systems: A 12-system review of systems was performed and was negative except as noted in the HPI.  --------------------------------------------------------------------------------------------------  Physical Exam: BP (!) 162/90 (BP Location: Right Arm, Patient Position: Sitting, Cuff Size: Normal)   Pulse 83   Ht 5\' 9"  (1.753 m)   Wt 252 lb (114.3 kg)   BMI 37.21 kg/m    Repeat BP: 158/90  General:  NAD. HEENT: No conjunctival pallor or scleral icterus. Moist mucous membranes. OP clear. Neck: Supple without lymphadenopathy, thyromegaly, JVD, or HJR. No carotid bruit. Lungs: Normal work of breathing. Clear to auscultation bilaterally without wheezes or crackles. Heart: Regular rate and rhythm without murmurs, rubs, or gallops. Non-displaced PMI. Abd: Bowel sounds present. Soft, NT/ND without hepatosplenomegaly Ext: No lower extremity edema. Radial, PT, and DP pulses are 2+ bilaterally Skin: Warm and dry without rash. Neuro: CNIII-XII intact. Strength and fine-touch sensation intact in upper and lower extremities bilaterally. Psych: Normal mood and affect.  EKG:  NSR without significant abnormality.  Lab Results  Component Value Date   WBC 5.4 11/27/2008   HGB 15.7 11/27/2008   HCT 44.0 11/27/2008   MCV 91.6 11/27/2008   PLT 187.0 11/27/2008    Lab Results  Component Value Date   NA 140 03/31/2018   K 4.4 03/31/2018   CL 105 03/31/2018   CO2 23 03/31/2018   BUN 10 03/31/2018   CREATININE 0.91 03/31/2018   GLUCOSE 95 03/31/2018   ALT 24 03/31/2018    Lab Results  Component Value Date   CHOL 168 03/31/2018   HDL 53.70 03/31/2018   LDLCALC 88 03/31/2018   TRIG 130.0 03/31/2018   CHOLHDL 3 03/31/2018     --------------------------------------------------------------------------------------------------  ASSESSMENT AND PLAN: Hypertension BP uncontrolled today.  We discussed lifestyle modifications, including sodium restriction and weight loss.  We will continue current dose of metoprolol.  If BP remains elevated in 3 months, addition of a second agent will need to be considered.  Cardiac risk counseling Risk factors include hypertension, male gender, and obesity.  As he is asymptomatic at active at work, I do not think that he needs to undergo ischemia testing at this time.  We discussed coronary calcium scoring but have agreed to defer  this.  The most important modifiable risk factors for Mr. Amada JupiterKirkpatrick are his blood pressure and weight.  We will readdress coronary calcium scoring when he returns for follow-up.  Follow-up: Return to clinic in 3 months.  Yvonne Kendallhristopher Romeka Scifres, MD 06/02/2018 4:12 PM

## 2018-06-02 NOTE — Patient Instructions (Addendum)
Medication Instructions:  No changes  If you need a refill on your cardiac medications before your next appointment, please call your pharmacy.   Lab work: None If you have labs (blood work) drawn today and your tests are completely normal, you will receive your results only by: Marland Kitchen. MyChart Message (if you have MyChart) OR . A paper copy in the mail If you have any lab test that is abnormal or we need to change your treatment, we will call you to review the results.  Testing/Procedures: None  Follow-Up: At [facility], you and your health needs are our priority.  As part of our continuing mission to provide you with exceptional heart care, we have created designated Provider Care Teams.  These Care Teams include your primary Cardiologist (physician) and Advanced Practice Providers (APPs -  Physician Assistants and Nurse Practitioners) who all work together to provide you with the care you need, when you need it. You will need a follow up appointment in 3 months.  Please call our office 2 months in advance to schedule this appointment.  You may see Dr. Cristal Deerhristopher Woods or one of the following Advanced Practice Providers on your designated Care Team:   Raymond Duckinghristopher Berge, NP Raymond Listenyan Dunn, PA-C . Raymond IvanJacquelyn Visser, PA-C  Any Other Special Instructions Will Be Listed Below (If Applicable).    DASH Eating Plan DASH stands for "Dietary Approaches to Stop Hypertension." The DASH eating plan is a healthy eating plan that has been shown to reduce high blood pressure (hypertension). It may also reduce your risk for type 2 diabetes, heart disease, and stroke. The DASH eating plan may also help with weight loss. What are tips for following this plan? General guidelines  Avoid eating more than 2,300 mg (milligrams) of salt (sodium) a day. If you have hypertension, you may need to reduce your sodium intake to 1,500 mg a day.  Limit alcohol intake to no more than 1 drink a day for nonpregnant women and  2 drinks a day for men. One drink equals 12 oz of beer, 5 oz of wine, or 1 oz of hard liquor.  Work with your health care provider to maintain a healthy body weight or to lose weight. Ask what an ideal weight is for you.  Get at least 30 minutes of exercise that causes your heart to beat faster (aerobic exercise) most days of the week. Activities may include walking, swimming, or biking.  Work with your health care provider or diet and nutrition specialist (dietitian) to adjust your eating plan to your individual calorie needs. Reading food labels  Check food labels for the amount of sodium per serving. Choose foods with less than 5 percent of the Daily Value of sodium. Generally, foods with less than 300 mg of sodium per serving fit into this eating plan.  To find whole grains, look for the word "whole" as the first word in the ingredient list. Shopping  Buy products labeled as "low-sodium" or "no salt added."  Buy fresh foods. Avoid canned foods and premade or frozen meals. Cooking  Avoid adding salt when cooking. Use salt-free seasonings or herbs instead of table salt or sea salt. Check with your health care provider or pharmacist before using salt substitutes.  Do not fry foods. Cook foods using healthy methods such as baking, boiling, grilling, and broiling instead.  Cook with heart-healthy oils, such as olive, canola, soybean, or sunflower oil. Meal planning   Eat a balanced diet that includes: ? 5 or  more servings of fruits and vegetables each day. At each meal, try to fill half of your plate with fruits and vegetables. ? Up to 6-8 servings of whole grains each day. ? Less than 6 oz of lean meat, poultry, or fish each day. A 3-oz serving of meat is about the same size as a deck of cards. One egg equals 1 oz. ? 2 servings of low-fat dairy each day. ? A serving of nuts, seeds, or beans 5 times each week. ? Heart-healthy fats. Healthy fats called Omega-3 fatty acids are found in  foods such as flaxseeds and coldwater fish, like sardines, salmon, and mackerel.  Limit how much you eat of the following: ? Canned or prepackaged foods. ? Food that is high in trans fat, such as fried foods. ? Food that is high in saturated fat, such as fatty meat. ? Sweets, desserts, sugary drinks, and other foods with added sugar. ? Full-fat dairy products.  Do not salt foods before eating.  Try to eat at least 2 vegetarian meals each week.  Eat more home-cooked food and less restaurant, buffet, and fast food.  When eating at a restaurant, ask that your food be prepared with less salt or no salt, if possible. What foods are recommended? The items listed may not be a complete list. Talk with your dietitian about what dietary choices are best for you. Grains Whole-grain or whole-wheat bread. Whole-grain or whole-wheat pasta. Brown rice. Raymond Woods. Bulgur. Whole-grain and low-sodium cereals. Pita bread. Low-fat, low-sodium crackers. Whole-wheat flour tortillas. Vegetables Fresh or frozen vegetables (raw, steamed, roasted, or grilled). Low-sodium or reduced-sodium tomato and vegetable juice. Low-sodium or reduced-sodium tomato sauce and tomato paste. Low-sodium or reduced-sodium canned vegetables. Fruits All fresh, dried, or frozen fruit. Canned fruit in natural juice (without added sugar). Meat and other protein foods Skinless chicken or Raymond Woods. Ground chicken or Raymond Woods. Pork with fat trimmed off. Fish and seafood. Egg whites. Dried beans, peas, or lentils. Unsalted nuts, nut butters, and seeds. Unsalted canned beans. Lean cuts of beef with fat trimmed off. Low-sodium, lean deli meat. Dairy Low-fat (1%) or fat-free (skim) milk. Fat-free, low-fat, or reduced-fat cheeses. Nonfat, low-sodium ricotta or cottage cheese. Low-fat or nonfat yogurt. Low-fat, low-sodium cheese. Fats and oils Soft margarine without trans fats. Vegetable oil. Low-fat, reduced-fat, or light mayonnaise and salad  dressings (reduced-sodium). Canola, safflower, olive, soybean, and sunflower oils. Avocado. Seasoning and other foods Herbs. Spices. Seasoning mixes without salt. Unsalted popcorn and pretzels. Fat-free sweets. What foods are not recommended? The items listed may not be a complete list. Talk with your dietitian about what dietary choices are best for you. Grains Baked goods made with fat, such as croissants, muffins, or some breads. Dry pasta or rice meal packs. Vegetables Creamed or fried vegetables. Vegetables in a cheese sauce. Regular canned vegetables (not low-sodium or reduced-sodium). Regular canned tomato sauce and paste (not low-sodium or reduced-sodium). Regular tomato and vegetable juice (not low-sodium or reduced-sodium). Rosita Fire. Olives. Fruits Canned fruit in a light or heavy syrup. Fried fruit. Fruit in cream or butter sauce. Meat and other protein foods Fatty cuts of meat. Ribs. Fried meat. Tomasa Blase. Sausage. Bologna and other processed lunch meats. Salami. Fatback. Hotdogs. Bratwurst. Salted nuts and seeds. Canned beans with added salt. Canned or smoked fish. Whole eggs or egg yolks. Chicken or Raymond Woods with skin. Dairy Whole or 2% milk, cream, and half-and-half. Whole or full-fat cream cheese. Whole-fat or sweetened yogurt. Full-fat cheese. Nondairy creamers. Whipped toppings. Processed cheese and  cheese spreads. Fats and oils Butter. Stick margarine. Lard. Shortening. Ghee. Bacon fat. Tropical oils, such as coconut, palm kernel, or palm oil. Seasoning and other foods Salted popcorn and pretzels. Onion salt, garlic salt, seasoned salt, table salt, and sea salt. Worcestershire sauce. Tartar sauce. Barbecue sauce. Teriyaki sauce. Soy sauce, including reduced-sodium. Steak sauce. Canned and packaged gravies. Fish sauce. Oyster sauce. Cocktail sauce. Horseradish that you find on the shelf. Ketchup. Mustard. Meat flavorings and tenderizers. Bouillon cubes. Hot sauce and Tabasco sauce.  Premade or packaged marinades. Premade or packaged taco seasonings. Relishes. Regular salad dressings. Where to find more information:  National Heart, Lung, and Blood Institute: PopSteam.is  American Heart Association: www.heart.org Summary  The DASH eating plan is a healthy eating plan that has been shown to reduce high blood pressure (hypertension). It may also reduce your risk for type 2 diabetes, heart disease, and stroke.  With the DASH eating plan, you should limit salt (sodium) intake to 2,300 mg a day. If you have hypertension, you may need to reduce your sodium intake to 1,500 mg a day.  When on the DASH eating plan, aim to eat more fresh fruits and vegetables, whole grains, lean proteins, low-fat dairy, and heart-healthy fats.  Work with your health care provider or diet and nutrition specialist (dietitian) to adjust your eating plan to your individual calorie needs. This information is not intended to replace advice given to you by your health care provider. Make sure you discuss any questions you have with your health care provider. Document Released: 06/24/2011 Document Revised: 06/28/2016 Document Reviewed: 06/28/2016 Elsevier Interactive Patient Education  [employer].

## 2018-08-28 NOTE — Progress Notes (Deleted)
Follow-up Outpatient Visit Date: 08/30/2018  Primary Care Provider: Tonia Ghent, MD [place] [address]  Chief Complaint: ***  HPI:  Raymond Woods is a 53 y.o. year-old male with history of hypertension and fatty liver, who presents for follow-up of cardiac risk.  I met him in November, at which time Raymond Woods was doing well without chest pain, shortness of breath, or palpitations.  Blood pressure was elevated at that time.  We discussed lifestyle modifications and pharmacologic therapy and opted for the former.  No further testing was recommended.  --------------------------------------------------------------------------------------------------  Cardiovascular History & Procedures: Cardiovascular Problems:  Family history of coronary artery disease  Risk Factors:  Hypertension, male gender, and obesity  Cath/PCI:  None  CV Surgery:  None  EP Procedures and Devices:  None  Non-Invasive Evaluation(s):  None  Recent CV Pertinent Labs: Lab Results  Component Value Date   CHOL 168 03/31/2018   HDL 53.70 03/31/2018   LDLCALC 88 03/31/2018   TRIG 130.0 03/31/2018   CHOLHDL 3 03/31/2018   K 4.4 03/31/2018   BUN 10 03/31/2018   CREATININE 0.91 03/31/2018    Past medical and surgical history were reviewed and updated in EPIC.  No outpatient medications have been marked as taking for the 08/30/18 encounter (Appointment) with Sankalp Ferrell, Raymond Gave, MD.    Allergies: Patient has no known allergies.  Social History   Tobacco Use  . Smoking status: Never Smoker  . Smokeless tobacco: Current User    Types: Chew  Substance Use Topics  . Alcohol use: Yes    Comment: fifth of liquor per week.  . Drug use: No    Family History  Problem Relation Age of Onset  . Heart attack Father 57  . Coronary artery disease Father   . Hypertension Father   . COPD Father   . Asthma Father   . Heart disease Father   . Diverticulitis  Brother        1  . Colitis Brother   . Hypertension Mother   . Coronary artery disease Paternal Grandfather   . Diabetes Unknown        cousins  . Stroke Paternal Grandmother   . Stroke Maternal Grandmother   . Prostate cancer Neg Hx   . Colon cancer Neg Hx     Review of Systems: A 12-system review of systems was performed and was negative except as noted in the HPI.  --------------------------------------------------------------------------------------------------  Physical Exam: There were no vitals taken for this visit.  General:  *** HEENT: No conjunctival pallor or scleral icterus. Moist mucous membranes.  OP clear. Neck: Supple without lymphadenopathy, thyromegaly, JVD, or HJR. No carotid bruit. Lungs: Normal work of breathing. Clear to auscultation bilaterally without wheezes or crackles. Heart: Regular rate and rhythm without murmurs, rubs, or gallops. Non-displaced PMI. Abd: Bowel sounds present. Soft, NT/ND without hepatosplenomegaly Ext: No lower extremity edema. Radial, PT, and DP pulses are 2+ bilaterally. Skin: Warm and dry without rash.  EKG:  ***  Lab Results  Component Value Date   WBC 5.4 11/27/2008   HGB 15.7 11/27/2008   HCT 44.0 11/27/2008   MCV 91.6 11/27/2008   PLT 187.0 11/27/2008    Lab Results  Component Value Date   NA 140 03/31/2018   K 4.4 03/31/2018   CL 105 03/31/2018   CO2 23 03/31/2018   BUN 10 03/31/2018   CREATININE 0.91 03/31/2018   GLUCOSE 95 03/31/2018   ALT 24 03/31/2018  Lab Results  Component Value Date   CHOL 168 03/31/2018   HDL 53.70 03/31/2018   LDLCALC 88 03/31/2018   TRIG 130.0 03/31/2018   CHOLHDL 3 03/31/2018    --------------------------------------------------------------------------------------------------  ASSESSMENT AND PLAN: Raymond Gave Odaliz Mcqueary, MD 08/28/2018 8:01 AM

## 2018-08-30 ENCOUNTER — Ambulatory Visit: Payer: BLUE CROSS/BLUE SHIELD | Admitting: Internal Medicine

## 2018-09-01 ENCOUNTER — Encounter: Payer: Self-pay | Admitting: Internal Medicine

## 2018-09-01 ENCOUNTER — Ambulatory Visit: Payer: BLUE CROSS/BLUE SHIELD | Admitting: Internal Medicine

## 2018-09-01 VITALS — BP 164/94 | HR 89 | Ht 69.0 in | Wt 261.0 lb

## 2018-09-01 DIAGNOSIS — I10 Essential (primary) hypertension: Secondary | ICD-10-CM | POA: Diagnosis not present

## 2018-09-01 DIAGNOSIS — Z7189 Other specified counseling: Secondary | ICD-10-CM

## 2018-09-01 MED ORDER — METOPROLOL SUCCINATE ER 100 MG PO TB24: 100.0000 mg | ORAL_TABLET | Freq: Every day | ORAL | 3 refills | Status: DC

## 2018-09-01 NOTE — Patient Instructions (Signed)
Medication Instructions:  Your physician has recommended you make the following change in your medication:  1- INCREASE Metoprolol 100 mg by mouth once a day.  If you need a refill on your cardiac medications before your next appointment, please call your pharmacy.   Lab work: none If you have labs (blood work) drawn today and your tests are completely normal, you will receive your results only by: Marland Kitchen. MyChart Message (if you have MyChart) OR . A paper copy in the mail If you have any lab test that is abnormal or we need to change your treatment, we will call you to review the results.  Testing/Procedures: none  Follow-Up: At [facility], you and your health needs are our priority.  As part of our continuing mission to provide you with exceptional heart care, we have created designated Provider Care Teams.  These Care Teams include your primary Cardiologist (physician) and Advanced Practice Providers (APPs -  Physician Assistants and Nurse Practitioners) who all work together to provide you with the care you need, when you need it. You will need a follow up appointment in 6 months.  Please call our office 2 months in advance to schedule this appointment.  You may see DR Cristal DeerHRISTOPHER END or one of the following Advanced Practice Providers on your designated Care Team:   Nicolasa Duckinghristopher Berge, NP Eula Listenyan Dunn, PA-C . Marisue IvanJacquelyn Visser, PA-C   Your physician recommends that you schedule a follow-up appointment in: 1 MONTH FOR NURSE VISIT FOR BLOOD PRESSURE CHECK.      DASH Eating Plan DASH stands for "Dietary Approaches to Stop Hypertension." The DASH eating plan is a healthy eating plan that has been shown to reduce high blood pressure (hypertension). It may also reduce your risk for type 2 diabetes, heart disease, and stroke. The DASH eating plan may also help with weight loss. What are tips for following this plan?  General guidelines  Avoid eating more than 2,300 mg (milligrams) of salt  (sodium) a day. If you have hypertension, you may need to reduce your sodium intake to 1,500 mg a day.  Limit alcohol intake to no more than 1 drink a day for nonpregnant women and 2 drinks a day for men. One drink equals 12 oz of beer, 5 oz of wine, or 1 oz of hard liquor.  Work with your health care provider to maintain a healthy body weight or to lose weight. Ask what an ideal weight is for you.  Get at least 30 minutes of exercise that causes your heart to beat faster (aerobic exercise) most days of the week. Activities may include walking, swimming, or biking.  Work with your health care provider or diet and nutrition specialist (dietitian) to adjust your eating plan to your individual calorie needs. Reading food labels   Check food labels for the amount of sodium per serving. Choose foods with less than 5 percent of the Daily Value of sodium. Generally, foods with less than 300 mg of sodium per serving fit into this eating plan.  To find whole grains, look for the word "whole" as the first word in the ingredient list. Shopping  Buy products labeled as "low-sodium" or "no salt added."  Buy fresh foods. Avoid canned foods and premade or frozen meals. Cooking  Avoid adding salt when cooking. Use salt-free seasonings or herbs instead of table salt or sea salt. Check with your health care provider or pharmacist before using salt substitutes.  Do not fry foods. Cook foods using healthy  methods such as baking, boiling, grilling, and broiling instead.  Cook with heart-healthy oils, such as olive, canola, soybean, or sunflower oil. Meal planning  Eat a balanced diet that includes: ? 5 or more servings of fruits and vegetables each day. At each meal, try to fill half of your plate with fruits and vegetables. ? Up to 6-8 servings of whole grains each day. ? Less than 6 oz of lean meat, poultry, or fish each day. A 3-oz serving of meat is about the same size as a deck of cards. One egg  equals 1 oz. ? 2 servings of low-fat dairy each day. ? A serving of nuts, seeds, or beans 5 times each week. ? Heart-healthy fats. Healthy fats called Omega-3 fatty acids are found in foods such as flaxseeds and coldwater fish, like sardines, salmon, and mackerel.  Limit how much you eat of the following: ? Canned or prepackaged foods. ? Food that is high in trans fat, such as fried foods. ? Food that is high in saturated fat, such as fatty meat. ? Sweets, desserts, sugary drinks, and other foods with added sugar. ? Full-fat dairy products.  Do not salt foods before eating.  Try to eat at least 2 vegetarian meals each week.  Eat more home-cooked food and less restaurant, buffet, and fast food.  When eating at a restaurant, ask that your food be prepared with less salt or no salt, if possible. What foods are recommended? The items listed may not be a complete list. Talk with your dietitian about what dietary choices are best for you. Grains Whole-grain or whole-wheat bread. Whole-grain or whole-wheat pasta. Brown rice. [name]. Bulgur. Whole-grain and low-sodium cereals. Pita bread. Low-fat, low-sodium crackers. Whole-wheat flour tortillas. Vegetables Fresh or frozen vegetables (raw, steamed, roasted, or grilled). Low-sodium or reduced-sodium tomato and vegetable juice. Low-sodium or reduced-sodium tomato sauce and tomato paste. Low-sodium or reduced-sodium canned vegetables. Fruits All fresh, dried, or frozen fruit. Canned fruit in natural juice (without added sugar). Meat and other protein foods Skinless chicken or [place]. Ground chicken or [place]. Pork with fat trimmed off. Fish and seafood. Egg whites. Dried beans, peas, or lentils. Unsalted nuts, nut butters, and seeds. Unsalted canned beans. Lean cuts of beef with fat trimmed off. Low-sodium, lean deli meat. Dairy Low-fat (1%) or fat-free (skim) milk. Fat-free, low-fat, or reduced-fat cheeses. Nonfat, low-sodium ricotta or  cottage cheese. Low-fat or nonfat yogurt. Low-fat, low-sodium cheese. Fats and oils Soft margarine without trans fats. Vegetable oil. Low-fat, reduced-fat, or light mayonnaise and salad dressings (reduced-sodium). Canola, safflower, olive, soybean, and sunflower oils. Avocado. Seasoning and other foods Herbs. Spices. Seasoning mixes without salt. Unsalted popcorn and pretzels. Fat-free sweets. What foods are not recommended? The items listed may not be a complete list. Talk with your dietitian about what dietary choices are best for you. Grains Baked goods made with fat, such as croissants, muffins, or some breads. Dry pasta or rice meal packs. Vegetables Creamed or fried vegetables. Vegetables in a cheese sauce. Regular canned vegetables (not low-sodium or reduced-sodium). Regular canned tomato sauce and paste (not low-sodium or reduced-sodium). Regular tomato and vegetable juice (not low-sodium or reduced-sodium). Rosita Fire. Olives. Fruits Canned fruit in a light or heavy syrup. Fried fruit. Fruit in cream or butter sauce. Meat and other protein foods Fatty cuts of meat. Ribs. Fried meat. Tomasa Blase. Sausage. Bologna and other processed lunch meats. Salami. Fatback. Hotdogs. Bratwurst. Salted nuts and seeds. Canned beans with added salt. Canned or smoked fish. Whole eggs  or egg yolks. Chicken or [place] with skin. Dairy Whole or 2% milk, cream, and half-and-half. Whole or full-fat cream cheese. Whole-fat or sweetened yogurt. Full-fat cheese. Nondairy creamers. Whipped toppings. Processed cheese and cheese spreads. Fats and oils Butter. Stick margarine. Lard. Shortening. Ghee. Bacon fat. Tropical oils, such as coconut, palm kernel, or palm oil. Seasoning and other foods Salted popcorn and pretzels. Onion salt, garlic salt, seasoned salt, table salt, and sea salt. Worcestershire sauce. Tartar sauce. Barbecue sauce. Teriyaki sauce. Soy sauce, including reduced-sodium. Steak sauce. Canned and packaged  gravies. Fish sauce. Oyster sauce. Cocktail sauce. Horseradish that you find on the shelf. Ketchup. Mustard. Meat flavorings and tenderizers. Bouillon cubes. Hot sauce and Tabasco sauce. Premade or packaged marinades. Premade or packaged taco seasonings. Relishes. Regular salad dressings. Where to find more information:  National Heart, Lung, and Blood Institute: PopSteam.is  American Heart Association: www.heart.org Summary  The DASH eating plan is a healthy eating plan that has been shown to reduce high blood pressure (hypertension). It may also reduce your risk for type 2 diabetes, heart disease, and stroke.  With the DASH eating plan, you should limit salt (sodium) intake to 2,300 mg a day. If you have hypertension, you may need to reduce your sodium intake to 1,500 mg a day.  When on the DASH eating plan, aim to eat more fresh fruits and vegetables, whole grains, lean proteins, low-fat dairy, and heart-healthy fats.  Work with your health care provider or diet and nutrition specialist (dietitian) to adjust your eating plan to your individual calorie needs. This information is not intended to replace advice given to you by your health care provider. Make sure you discuss any questions you have with your health care provider. Document Released: 06/24/2011 Document Revised: 06/28/2016 Document Reviewed: 06/28/2016 Elsevier Interactive Patient Education  2019 [employer].

## 2018-09-01 NOTE — Progress Notes (Signed)
Follow-up Outpatient Visit Date: 09/01/2018  Primary Care Provider: Tonia Ghent, MD [place] [address]  Chief Complaint: Follow-up hypertension  HPI:  Mr. Raymond Woods is a 53 y.o. year-old male with history of hypertension and fatty liver, who presents for follow-up of hypertension and cardiac risk.  I met him in November for cardiac risk assessment in the setting of family history of CAD.  At that time, he was doing well other than occasional daytime somnolence.  We did not pursue any further testing at that time.  We discussed lifestyle modifications for management of elevated blood pressure; we agreed to defer medication changes.  Unfortunately, Raymond Woods has not been able to limit his sodium intake on a consistent basis nor work on other lifestyle modifications (weight loss, exercise, etc).  He denies chest pain, shortness of breath, palpitations, lightheadedness, and orthopnea.  At times, he feels like his hands feel a little puffy, which he attributes to elevations on his blood pressure.  He denies edema otherwise.  He has been compliant with his medications.  He does not check his BP at home.  --------------------------------------------------------------------------------------------------  Cardiovascular History & Procedures: Cardiovascular Problems:  Family history of coronary artery disease  Risk Factors:  Hypertension, male gender, and obesity  Cath/PCI:  None  CV Surgery:  None  EP Procedures and Devices:  None  Non-Invasive Evaluation(s):  None  Recent CV Pertinent Labs: Lab Results  Component Value Date   CHOL 168 03/31/2018   HDL 53.70 03/31/2018   LDLCALC 88 03/31/2018   TRIG 130.0 03/31/2018   CHOLHDL 3 03/31/2018   K 4.4 03/31/2018   BUN 10 03/31/2018   CREATININE 0.91 03/31/2018    Past medical and surgical history were reviewed and updated in EPIC.  Current Meds  Medication Sig  . calcium  carbonate-magnesium hydroxide (ROLAIDS) 334 MG CHEW Chew 1 tablet by mouth 2 (two) times daily with a meal.    . fish oil-omega-3 fatty acids 1000 MG capsule Take 2 g by mouth daily.    . Multiple Vitamin (MULTIVITAMIN) tablet Take 1 tablet by mouth daily.    . NON FORMULARY Vitamin B-Active daily  . Probiotic Product (PROBIOTIC PO) Take 1 capsule by mouth every other day.    . vitamin B-12 (CYANOCOBALAMIN) 1000 MCG tablet Take 1,000 mcg by mouth. OTC 1 daily with Zinc   . vitamin C (ASCORBIC ACID) 500 MG tablet Take 500 mg by mouth daily.    . [DISCONTINUED] metoprolol succinate (TOPROL-XL) 50 MG 24 hr tablet Take 1 tablet (50 mg total) by mouth daily.    Allergies: Patient has no known allergies.  Social History   Tobacco Use  . Smoking status: Never Smoker  . Smokeless tobacco: Current User    Types: Chew  Substance Use Topics  . Alcohol use: Yes    Comment: fifth of liquor per week.  . Drug use: No    Family History  Problem Relation Age of Onset  . Heart attack Father 15  . Coronary artery disease Father   . Hypertension Father   . COPD Father   . Asthma Father   . Heart disease Father   . Diverticulitis Brother        3  . Colitis Brother   . Hypertension Mother   . Coronary artery disease Paternal Grandfather   . Diabetes Other        cousins  . Stroke Paternal Grandmother   . Stroke Maternal Grandmother   .  Prostate cancer Neg Hx   . Colon cancer Neg Hx     Review of Systems: A 12-system review of systems was performed and was negative except as noted in the HPI.  --------------------------------------------------------------------------------------------------  Physical Exam: BP (!) 164/94 (BP Location: Left Arm, Patient Position: Sitting, Cuff Size: Normal)   Pulse 89   Ht '5\' 9"'  (1.753 m)   Wt 261 lb (118.4 kg)   BMI 38.54 kg/m   General:  NAD HEENT: No conjunctival pallor or scleral icterus. Moist mucous membranes.  OP clear. Neck: Supple  without lymphadenopathy, thyromegaly, JVD, or HJR. Lungs: Normal work of breathing. Clear to auscultation bilaterally without wheezes or crackles. Heart: Regular rate and rhythm without murmurs, rubs, or gallops. Non-displaced PMI. Abd: Bowel sounds present. Soft, NT/ND without hepatosplenomegaly Ext: Trace ankle edema bilaterally. Skin: Warm and dry without rash.  EKG:  NSR without abnormality.  Lab Results  Component Value Date   WBC 5.4 11/27/2008   HGB 15.7 11/27/2008   HCT 44.0 11/27/2008   MCV 91.6 11/27/2008   PLT 187.0 11/27/2008    Lab Results  Component Value Date   NA 140 03/31/2018   K 4.4 03/31/2018   CL 105 03/31/2018   CO2 23 03/31/2018   BUN 10 03/31/2018   CREATININE 0.91 03/31/2018   GLUCOSE 95 03/31/2018   ALT 24 03/31/2018    Lab Results  Component Value Date   CHOL 168 03/31/2018   HDL 53.70 03/31/2018   LDLCALC 88 03/31/2018   TRIG 130.0 03/31/2018   CHOLHDL 3 03/31/2018    --------------------------------------------------------------------------------------------------  ASSESSMENT AND PLAN: Hypertension Still poorly controlled.  Raymond Woods has not been limiting his sodium intake on a regular basis.  I encouraged him to do so.  I also recommended that we add an additional agent to control his hypertension.  However, he does not wish to take any additional pills at this time.  We have therefore agreed to increase metoprolol succinate to 100 mg daily, though I am doubtful that he will reach a goal BP of <140/90 with this alone.  I will have him return in ~1 month for a BP check.  If he is still not at goal, we will plan to add a second agent.  Cardiac risk reduction Treat hypertension, as above.  Weight loss through diet and exercise encouraged.  Follow-up: BP check in 1 month.  Return to see me in 6 months.  [name], MD 09/02/2018 11:48 AM

## 2018-09-02 ENCOUNTER — Encounter: Payer: Self-pay | Admitting: Internal Medicine

## 2018-10-02 ENCOUNTER — Ambulatory Visit: Payer: BLUE CROSS/BLUE SHIELD

## 2018-10-04 ENCOUNTER — Telehealth: Payer: Self-pay | Admitting: Internal Medicine

## 2018-10-04 NOTE — Telephone Encounter (Signed)
   The patient is pending a follow up BP check with for Dr. Okey Dupre tomorrow.  Per Dr. Okey Dupre, he would still like the patient to come in to have this done if he is not monitoring his BP at home.   I spoke with the patient. He does not have a BP cuff at home. _____________   COVID-19 Pre-Screening Questions:  . Have you recently travelled abroad or to [place], [place] state, or [place]? No (3) . Do you currently have a fever? No (3) . Have you been in contact with someone that is currently pending confirmation of Covid19 testing or has been confirmed to have the Covid19 virus?  No (3) . Are you currently experiencing fatigue or cough? No (2) . Are you currently experiencing shortness of breath at rest or with minimal activity? No (1) . Have you been in contact with someone that was recently sick with fever/cough/fatigue? No (1)  I have advised the patient we will check his BP tomorrow. He is aware to take his medications as prescribed. Metoprolol is taken at night.  He is aware to come unaccompanied if possible and that he will get a temperature check on arrival.

## 2018-10-05 ENCOUNTER — Other Ambulatory Visit: Payer: Self-pay

## 2018-10-05 ENCOUNTER — Ambulatory Visit (INDEPENDENT_AMBULATORY_CARE_PROVIDER_SITE_OTHER): Payer: BLUE CROSS/BLUE SHIELD | Admitting: *Deleted

## 2018-10-05 VITALS — BP 152/99 | HR 80 | Ht 69.0 in | Wt 255.0 lb

## 2018-10-05 DIAGNOSIS — I10 Essential (primary) hypertension: Secondary | ICD-10-CM | POA: Diagnosis not present

## 2018-10-05 NOTE — Progress Notes (Signed)
1.) Reason for visit: BP check  2.) Name of MD requesting visit: Dr End*  3.) H&P: HTN   Last OV 09/01/18, Dr End increase metoprolol to 100 mg by mouth once a day.   4.) ROS related to problem: Denies any chest pain, shortness of breath, dizziness or swelling. He does not have a way to check BP at home. 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   BP today 152/99, HR 80.   5.) Assessment and plan per MD: Routing to Dr End for advice. Patient aware we will call him with further instructions.

## 2018-10-06 MED ORDER — AMLODIPINE BESYLATE 5 MG PO TABS: 5.0000 mg | ORAL_TABLET | Freq: Every day | ORAL | 6 refills | Status: DC

## 2018-10-06 NOTE — Progress Notes (Signed)
Please let Mr. Raymond Woods know that his blood pressure remains suboptimally controlled.  While recent stressors may be contributing to elevated readings, it should be noted that his BP was uncontrolled even before the coronavirus outbreak.  I recommend adding amlodipine 5 mg daily.  If he wishes to discuss this further, we can arrange for a telemedicine visit next week.  Yvonne Kendall, MD [facility] Pager: [phone]

## 2018-10-06 NOTE — Addendum Note (Signed)
Addended bySherri Rad C on: 10/06/2018 02:10 PM   Modules accepted: Orders

## 2018-10-06 NOTE — Progress Notes (Signed)
I spoke with the patient regarding Dr. Serita Kyle recommendations to add amlodipine 5 mg once daily to his regimen for elevated BP. The patient voices understanding and is agreeable at this time.  He states he bought a BP cuff last night. I have advised that he take his BP for about week after starting amlodipine (30 minutes-1 hour after his meds) and call us with the readings.  Again, the patient was agreeable.

## 2018-12-26 ENCOUNTER — Telehealth: Payer: Self-pay | Admitting: Family Medicine

## 2018-12-26 ENCOUNTER — Telehealth: Payer: Self-pay | Admitting: *Deleted

## 2018-12-26 ENCOUNTER — Other Ambulatory Visit: Payer: BLUE CROSS/BLUE SHIELD

## 2018-12-26 DIAGNOSIS — Z20822 Contact with and (suspected) exposure to covid-19: Secondary | ICD-10-CM

## 2018-12-26 LAB — NOVEL CORONAVIRUS, NAA: SARS-CoV-2, NAA: NOT DETECTED

## 2018-12-26 NOTE — Telephone Encounter (Signed)
[id number] testing referred by Dr.Duncan due to possible contact. Scheduled for today at 3:30 at [facility] site. Informed to wear mask and stay in vehicle.

## 2018-12-26 NOTE — Telephone Encounter (Signed)
Does he qualify for one of the community screening site based on hx of HTN and exposure?  Does he need an order from me? I don't think he needs a visit given the lack of symptoms.   Please let me know.  Thanks.

## 2018-12-26 NOTE — Telephone Encounter (Signed)
Patient called into the office concerned with postive Covid exposure.  Daughter who lives with patient has been in close contact with someone who has tested positive for COVID. Patient would like to be tested. He stated that he would separate himself from others until he is advised what he should do      Patient's Phone- [phone]

## 2018-12-26 NOTE — Telephone Encounter (Signed)
Note, patient is asymptomatic and positive exposure was diagnosed this past Saturday.   Dr. Damita Dunnings, please let me know if you need any help looking in to scheduling patient testing.  I was not sure he would need a virtual visit for this based on the fact that he is currently asymptomatic.   Let me know if I can help.

## 2018-12-26 NOTE — Telephone Encounter (Signed)
Pt returned your call. He is requesting a call back.

## 2018-12-26 NOTE — Telephone Encounter (Signed)
COVID testing request put in to the [facility] for scheduling.  I have also contacted patient to listen out for a call to be scheduled for testing and he is aware to stay on home quarantine until we have test results back and can direct him from there.

## 2018-12-26 NOTE — Telephone Encounter (Signed)
[facility] has been contacted to schedule screening for patient.  Thanks.

## 2018-12-29 NOTE — Telephone Encounter (Signed)
Best number [phone] Pt called to see if you received the covid test results

## 2018-12-29 NOTE — Telephone Encounter (Signed)
  Please review. Do not see any results in epic yet unless it came by the fax

## 2018-12-29 NOTE — Telephone Encounter (Signed)
Patient advised.

## 2018-12-29 NOTE — Telephone Encounter (Signed)
Please tell him I don't have results yet, I'm awaiting that.  Thanks.

## 2019-01-01 NOTE — Telephone Encounter (Addendum)
Due to the significant increase in testing as a result of the protesting and [employer] large scale exposure in the community, we are beginning to see extensive delays in result reporting from the test sites over the past week.  I am not sure when results will be in; hopefully very soon, but delays are being seen right now.  The command center did confirm the above delays as they are running out of rapid tests and are giving those patients from the hospital who normal would receive the rapid, higher priority.  So there has been a bottleneck until the rapid testing supply replenishes.

## 2019-01-01 NOTE — Telephone Encounter (Signed)
Patient advised.

## 2019-01-01 NOTE — Telephone Encounter (Signed)
I don't have any results yet.  Leafy Ro- are you aware of when we should expect results?

## 2019-01-01 NOTE — Telephone Encounter (Signed)
Patient called to check status of COVID results.    BEST PHONE NUMBER- [phone]

## 2019-01-02 ENCOUNTER — Encounter: Payer: Self-pay | Admitting: Hematology

## 2019-01-02 NOTE — Telephone Encounter (Signed)
Patient called back to see if we have results.

## 2019-01-02 NOTE — Telephone Encounter (Signed)
Best number [phone] Pt aware of dr Josefine Class comment.  Pt stated it has been 7 days

## 2019-01-02 NOTE — Telephone Encounter (Signed)
Left detailed message on voicemail.  

## 2019-01-02 NOTE — Telephone Encounter (Signed)
See note below.  Patient was advised yesterday about the delay in results,  Patient is quite anxious for results.  Please advise.

## 2019-01-02 NOTE — Telephone Encounter (Signed)
I don't have results yet.  I can check daily to see and we'll call him when we hear anything.

## 2019-01-03 ENCOUNTER — Telehealth: Payer: Self-pay | Admitting: Family Medicine

## 2019-01-03 NOTE — Telephone Encounter (Addendum)
Updated patient. The results were updated on 01/02/2019.  Covid test was negative. Patient is aware

## 2019-01-03 NOTE — Telephone Encounter (Signed)
Notify pt.  covid test neg, results now available.  Thanks.

## 2019-01-04 NOTE — Telephone Encounter (Signed)
Patient advised.

## 2019-04-06 ENCOUNTER — Other Ambulatory Visit: Payer: Self-pay

## 2019-04-06 ENCOUNTER — Other Ambulatory Visit (INDEPENDENT_AMBULATORY_CARE_PROVIDER_SITE_OTHER): Payer: BC Managed Care – PPO

## 2019-04-06 ENCOUNTER — Other Ambulatory Visit: Payer: Self-pay | Admitting: Family Medicine

## 2019-04-06 DIAGNOSIS — I10 Essential (primary) hypertension: Secondary | ICD-10-CM | POA: Diagnosis not present

## 2019-04-06 LAB — LIPID PANEL
Cholesterol: 186 mg/dL (ref 0–200)
HDL: 50.2 mg/dL (ref >39.00)
LDL Cholesterol: 120 mg/dL — ABNORMAL HIGH (ref 0–99)
NonHDL: 135.91
Total CHOL/HDL Ratio: 4
Triglycerides: 78 mg/dL (ref 0.0–149.0)
VLDL: 15.6 mg/dL (ref 0.0–40.0)

## 2019-04-06 LAB — COMPREHENSIVE METABOLIC PANEL
ALT: 31 U/L (ref 0–53)
AST: 21 U/L (ref 0–37)
Albumin: 4.3 g/dL (ref 3.5–5.2)
Alkaline Phosphatase: 69 U/L (ref 39–117)
BUN: 15 mg/dL (ref 6–23)
CO2: 29 mEq/L (ref 19–32)
Calcium: 9.1 mg/dL (ref 8.4–10.5)
Chloride: 101 mEq/L (ref 96–112)
Creatinine, Ser: 0.96 mg/dL (ref 0.40–1.50)
GFR: 82.39 mL/min (ref >60.00)
Glucose, Bld: 103 mg/dL — ABNORMAL HIGH (ref 70–99)
Potassium: 4.7 mEq/L (ref 3.5–5.1)
Sodium: 138 mEq/L (ref 135–145)
Total Bilirubin: 0.6 mg/dL (ref 0.2–1.2)
Total Protein: 6.7 g/dL (ref 6.0–8.3)

## 2019-04-12 ENCOUNTER — Ambulatory Visit (INDEPENDENT_AMBULATORY_CARE_PROVIDER_SITE_OTHER): Payer: BC Managed Care – PPO | Admitting: Family Medicine

## 2019-04-12 ENCOUNTER — Encounter: Payer: Self-pay | Admitting: Family Medicine

## 2019-04-12 ENCOUNTER — Other Ambulatory Visit: Payer: Self-pay

## 2019-04-12 VITALS — BP 136/78 | HR 66 | Temp 97.7°F | Ht 69.0 in | Wt 261.5 lb

## 2019-04-12 DIAGNOSIS — Z0000 Encounter for general adult medical examination without abnormal findings: Secondary | ICD-10-CM

## 2019-04-12 DIAGNOSIS — Z1211 Encounter for screening for malignant neoplasm of colon: Secondary | ICD-10-CM | POA: Diagnosis not present

## 2019-04-12 DIAGNOSIS — I10 Essential (primary) hypertension: Secondary | ICD-10-CM

## 2019-04-12 DIAGNOSIS — Z7189 Other specified counseling: Secondary | ICD-10-CM

## 2019-04-12 MED ORDER — METOPROLOL SUCCINATE ER 100 MG PO TB24: 100.0000 mg | ORAL_TABLET | Freq: Every day | ORAL | 3 refills | Status: DC

## 2019-04-12 MED ORDER — AMLODIPINE BESYLATE 5 MG PO TABS: 5.0000 mg | ORAL_TABLET | Freq: Every day | ORAL | 3 refills | Status: DC

## 2019-04-12 NOTE — Progress Notes (Signed)
CPE- See plan.  Routine anticipatory guidance given to patient.  See health maintenance.  The possibility exists that previously documented standard health maintenance information may have been brought forward from a previous encounter into this note.  If needed, that same information has been updated to reflect the current situation based on today's encounter.    Tetanus 2017.  PNA and shingles not due yet, d/w pt.  Flu shot encouraged. D/w pt. repeatedly declined by patient. Prostate cancer screening and PSA options (with potential risks and benefits of testing vs not testing) were discussed along with recent recs/guidelines.  He declined testing PSA at this point. D/w patient [id number] for colon cancer screening, including IFOB vs. colonoscopy.  Risks and benefits of both were discussed and patient voiced understanding.  Pt elects for cologuard.  Living will d/w pt. Wife designated if patient were incapacitated.  Diet and exercise d/w pt.   H/o hyperglycemia d/w pt. Labs d/w pt.   HIV testing d/w pt. Per patient, was neg on testing with outside labs ~2014. D/w pt about dip tobacco, encouraged cessation.he is considering options.  Hypertension:    Using medication without problems or lightheadedness: yes Chest pain with exertion:no Edema:no Short of breath:no  PMH and SH reviewed  Meds, vitals, and allergies reviewed.   ROS: Per HPI.  Unless specifically indicated otherwise in HPI, the patient denies:  General: fever. Eyes: acute vision changes ENT: sore throat Cardiovascular: chest pain Respiratory: SOB GI: vomiting GU: dysuria Musculoskeletal: acute back pain Derm: acute rash Neuro: acute motor dysfunction Psych: worsening mood Endocrine: polydipsia Heme: bleeding Allergy: hayfever  GEN: nad, alert and oriented HEENT: ncat NECK: supple w/o LA CV: rrr. PULM: ctab, no inc wob ABD: soft, +bs EXT: no edema SKIN: no acute rash

## 2019-04-12 NOTE — Patient Instructions (Signed)
I would get a flu shot each fall.   Keep working on diet and exercise.  Update me as needed.  Take care.  Glad to see you.

## 2019-04-15 NOTE — Assessment & Plan Note (Signed)
Living will d/w pt.  Wife designated if patient were incapacitated.   ?

## 2019-04-15 NOTE — Assessment & Plan Note (Signed)
Tetanus 2017.  PNA and shingles not due yet, d/w pt.  Flu shot encouraged. D/w pt. repeatedly declined by patient. Prostate cancer screening and PSA options (with potential risks and benefits of testing vs not testing) were discussed along with recent recs/guidelines.  He declined testing PSA at this point. D/w patient [id number] for colon cancer screening, including IFOB vs. colonoscopy.  Risks and benefits of both were discussed and patient voiced understanding.  Pt elects for cologuard.  Living will d/w pt. Wife designated if patient were incapacitated.  Diet and exercise d/w pt.   H/o hyperglycemia d/w pt. Labs d/w pt.   HIV testing d/w pt. Per patient, was neg on testing with outside labs ~2014. D/w pt about dip tobacco, encouraged cessation.he is considering options.

## 2019-04-15 NOTE — Assessment & Plan Note (Signed)
Reasonable control but needs work on diet and exercise.  Labs discussed with patient.  No change in meds.

## 2019-04-16 NOTE — Addendum Note (Signed)
Addended by: Josetta Huddle on: 04/16/2019 08:48 AM   Modules accepted: Orders

## 2019-08-16 ENCOUNTER — Ambulatory Visit (INDEPENDENT_AMBULATORY_CARE_PROVIDER_SITE_OTHER): Payer: BC Managed Care – PPO | Admitting: Internal Medicine

## 2019-08-16 ENCOUNTER — Encounter: Payer: Self-pay | Admitting: Internal Medicine

## 2019-08-16 ENCOUNTER — Other Ambulatory Visit: Payer: Self-pay

## 2019-08-16 VITALS — BP 142/70 | HR 78 | Ht 68.0 in | Wt 263.8 lb

## 2019-08-16 DIAGNOSIS — I10 Essential (primary) hypertension: Secondary | ICD-10-CM

## 2019-08-16 MED ORDER — AMLODIPINE BESYLATE 10 MG PO TABS: 10.0000 mg | ORAL_TABLET | Freq: Every day | ORAL | 2 refills | Status: DC

## 2019-08-16 NOTE — Patient Instructions (Signed)
Medication Instructions:  Your physician has recommended you make the following change in your medication:  1- INCREASE Amlodipine to 10 mg by mouth once a day.  *If you need a refill on your cardiac medications before your next appointment, please call your pharmacy*  Lab Work: none If you have labs (blood work) drawn today and your tests are completely normal, you will receive your results only by: Marland Kitchen MyChart Message (if you have MyChart) OR . A paper copy in the mail If you have any lab test that is abnormal or we need to change your treatment, we will call you to review the results.  Testing/Procedures: none  Follow-Up: At [facility], you and your health needs are our priority.  As part of our continuing mission to provide you with exceptional heart care, we have created designated Provider Care Teams.  These Care Teams include your primary Cardiologist (physician) and Advanced Practice Providers (APPs -  Physician Assistants and Nurse Practitioners) who all work together to provide you with the care you need, when you need it.  Your next appointment:   6 month(s)  The format for your next appointment:   In Person  Provider:    You may see DR Cristal Deer END or one of the following Advanced Practice Providers on your designated Care Team:    Nicolasa Ducking, NP  Eula Listen, PA-C  Marisue Ivan, PA-C

## 2019-08-16 NOTE — Progress Notes (Signed)
Follow-up Outpatient Visit Date: 08/16/2019  Primary Care Provider: Joaquim Nam, MD [address] [place] [address]  Chief Complaint: Follow-up hypertension  HPI:  Raymond Woods is a 54 y.o. male with history of hypertension and hepatic steatosis, who presents for follow-up of hypertension and cardiac risk in the setting of family history of coronary artery disease.  I last saw him in 08/2018, at which time he was feeling relatively well other than occasional puffiness in his hands that he attributed to elevated blood pressure.  He had not been limiting his sodium intake nor working on other lifestyle modifications.  I recommended adding a second agent to control his blood pressure, though [name] declined.  We therefore agreed to increase metoprolol succinate to 100 mg daily and work on lifestyle modifications.  He returned in mid March for blood pressure check, with pressure noted to be 152/99.  I recommended adding amlodipine 5 mg daily.  Blood pressure was better controlled when he saw Dr. Para March for follow-up in September (136/78).  Today, [name] reports feeling relatively well.  He states that he seems a little bit slower than in the past and gets worn out easily.  He denies chest pain, shortness of breath, palpitations, lightheadedness, and edema.  Sometimes, he feels like his heart races a little bit when he gets anxious.  He walks a lot at work but does not exercise regularly.  He has not lost any weight.  He does not limit his sodium intake and eats out most days for lunch.  He is tolerating amlodipine and metoprolol well.  He does not check his blood pressure regularly at home.  --------------------------------------------------------------------------------------------------  Cardiovascular History & Procedures: Cardiovascular Problems:  Family history of coronary artery disease  Risk Factors:  Hypertension, male gender, and  obesity  Cath/PCI:  None  CV Surgery:  None  EP Procedures and Devices:  None  Non-Invasive Evaluation(s):  None  Recent CV Pertinent Labs: Lab Results  Component Value Date   CHOL 186 04/06/2019   HDL 50.20 04/06/2019   LDLCALC 120 (H) 04/06/2019   TRIG 78.0 04/06/2019   CHOLHDL 4 04/06/2019   K 4.7 04/06/2019   BUN 15 04/06/2019   CREATININE 0.96 04/06/2019    Past medical and surgical history were reviewed and updated in EPIC.  Current Meds  Medication Sig  . calcium carbonate-magnesium hydroxide (ROLAIDS) 334 MG CHEW Chew 1 tablet by mouth as needed.   . fish oil-omega-3 fatty acids 1000 MG capsule Take 2 g by mouth daily.    . metoprolol succinate (TOPROL-XL) 100 MG 24 hr tablet Take 1 tablet (100 mg total) by mouth daily. Take with or immediately following a meal.  . Multiple Vitamin (MULTIVITAMIN) tablet Take 1 tablet by mouth daily.    . [DISCONTINUED] amLODipine (NORVASC) 5 MG tablet Take 1 tablet (5 mg total) by mouth daily.    Allergies: Patient has no known allergies.  Social History   Tobacco Use  . Smoking status: Never Smoker  . Smokeless tobacco: Current User    Types: Chew  Substance Use Topics  . Alcohol use: Yes    Comment: 2 drinks per night  . Drug use: No    Family History  Problem Relation Age of Onset  . Heart attack Father 28  . Coronary artery disease Father   . Hypertension Father   . COPD Father   . Asthma Father   . Heart disease Father   . Diverticulitis  Brother        55  . Colitis Brother   . Hypertension Mother   . Coronary artery disease Paternal Grandfather   . Diabetes Other        cousins  . Stroke Paternal Grandmother   . Stroke Maternal Grandmother   . Prostate cancer Neg Hx   . Colon cancer Neg Hx     Review of Systems: A 12-system review of systems was performed and was negative except as noted in the  HPI.  --------------------------------------------------------------------------------------------------  Physical Exam: BP (!) 142/70 (BP Location: Left Arm, Patient Position: Sitting, Cuff Size: Large)   Pulse 78   Ht 5\' 8"  (1.727 m)   Wt 263 lb 12 oz (119.6 kg)   SpO2 99%   BMI 40.10 kg/m   General: NAD. HEENT: No conjunctival pallor or scleral icterus. Facemask in place. Neck: Supple without lymphadenopathy, thyromegaly, JVD, or HJR. Lungs: Normal work of breathing. Clear to auscultation bilaterally without wheezes or crackles. Heart: Distant heart sounds.  Regular rate and rhythm without murmurs, rubs, or gallops.  Unable to assess PMI due to body habitus. Abd: Bowel sounds present. Soft, NT/ND.  Unable to assess HSM due to body habitus. Ext: Trace pretibial edema. Skin: Warm and dry without rash.  EKG: Normal sinus rhythm without abnormality.  Lab Results  Component Value Date   WBC 5.4 11/27/2008   HGB 15.7 11/27/2008   HCT 44.0 11/27/2008   MCV 91.6 11/27/2008   PLT 187.0 11/27/2008    Lab Results  Component Value Date   NA 138 04/06/2019   K 4.7 04/06/2019   CL 101 04/06/2019   CO2 29 04/06/2019   BUN 15 04/06/2019   CREATININE 0.96 04/06/2019   GLUCOSE 103 (H) 04/06/2019   ALT 31 04/06/2019    Lab Results  Component Value Date   CHOL 186 04/06/2019   HDL 50.20 04/06/2019   LDLCALC 120 (H) 04/06/2019   TRIG 78.0 04/06/2019   CHOLHDL 4 04/06/2019    --------------------------------------------------------------------------------------------------  ASSESSMENT AND PLAN: Hypertension: Blood pressure mildly elevated today.  We discussed the importance of lifestyle modifications including sodium restriction, weight loss, and exercise.  We have agreed to increase amlodipine to 10 mg daily.  We will continue metoprolol succinate 100 mg daily.  Morbid obesity: BMI greater than 40.  I encouraged [name] to work on increasing his activity as well as  to modify his diet in order to lose weight.  If he has worsening fatigue or his blood pressure remains resistant to medical therapy, we will need to consider further evaluation for sleep apnea.  Follow-up: Return to clinic in 6 months.  [name], MD 08/17/2019 7:40 AM

## 2019-08-17 ENCOUNTER — Encounter: Payer: Self-pay | Admitting: Internal Medicine

## 2020-02-20 ENCOUNTER — Ambulatory Visit: Payer: BC Managed Care – PPO | Admitting: Internal Medicine

## 2020-02-20 NOTE — Progress Notes (Deleted)
Follow-up Outpatient Visit Date: 02/20/2020  Primary Care Provider: Joaquim Nam, MD [address] [place] [address]  Chief Complaint: ***  HPI:  Raymond Woods is a 54 y.o. male with history of hypertension and hepatic steatosis, who presents for follow-up of hypertension and cardiac risk in the setting of family history of CAD.  I last saw Mr Stegman in late January, at which time he reported feeling a bit more fatigued than in the past.  He otherwise was asymptomatic.  Blood pressure was mildly elevated, prompting [place] to increase amlodipine to 10 mg daily.  Weight loss was also encouraged.  --------------------------------------------------------------------------------------------------  Cardiovascular History & Procedures: Cardiovascular Problems:  Family history of coronary artery disease  Risk Factors:  Hypertension, male gender, and obesity  Cath/PCI:  None  CV Surgery:  None  EP Procedures and Devices:  None  Non-Invasive Evaluation(s):  None  Recent CV Pertinent Labs: Lab Results  Component Value Date   CHOL 186 04/06/2019   HDL 50.20 04/06/2019   LDLCALC 120 (H) 04/06/2019   TRIG 78.0 04/06/2019   CHOLHDL 4 04/06/2019   K 4.7 04/06/2019   BUN 15 04/06/2019   CREATININE 0.96 04/06/2019    Past medical and surgical history were reviewed and updated in EPIC.  No outpatient medications have been marked as taking for the 02/20/20 encounter (Appointment) with Granite Godman, Cristal Deer, MD.    Allergies: Patient has no known allergies.  Social History   Tobacco Use  . Smoking status: Never Smoker  . Smokeless tobacco: Current User    Types: Chew  Vaping Use  . Vaping Use: Never used  Substance Use Topics  . Alcohol use: Yes    Comment: 2 drinks per night  . Drug use: No    Family History  Problem Relation Age of Onset  . Heart attack Father 16  . Coronary artery disease Father   . Hypertension Father   . COPD Father     . Asthma Father   . Heart disease Father   . Diverticulitis Brother        33  . Colitis Brother   . Hypertension Mother   . Coronary artery disease Paternal Grandfather   . Diabetes Other        cousins  . Stroke Paternal Grandmother   . Stroke Maternal Grandmother   . Prostate cancer Neg Hx   . Colon cancer Neg Hx     Review of Systems: A 12-system review of systems was performed and was negative except as noted in the HPI.  --------------------------------------------------------------------------------------------------  Physical Exam: There were no vitals taken for this visit.  General:  *** HEENT: No conjunctival pallor or scleral icterus. Facemask in place. Neck: Supple without lymphadenopathy, thyromegaly, JVD, or HJR. Lungs: Normal work of breathing. Clear to auscultation bilaterally without wheezes or crackles. Heart: Regular rate and rhythm without murmurs, rubs, or gallops. Non-displaced PMI. Abd: Bowel sounds present. Soft, NT/ND without hepatosplenomegaly Ext: No lower extremity edema. Radial, PT, and DP pulses are 2+ bilaterally. Skin: Warm and dry without rash.  EKG:  ***  Lab Results  Component Value Date   WBC 5.4 11/27/2008   HGB 15.7 11/27/2008   HCT 44.0 11/27/2008   MCV 91.6 11/27/2008   PLT 187.0 11/27/2008    Lab Results  Component Value Date   NA 138 04/06/2019   K 4.7 04/06/2019   CL 101 04/06/2019   CO2 29 04/06/2019   BUN 15 04/06/2019   CREATININE  0.96 04/06/2019   GLUCOSE 103 (H) 04/06/2019   ALT 31 04/06/2019    Lab Results  Component Value Date   CHOL 186 04/06/2019   HDL 50.20 04/06/2019   LDLCALC 120 (H) 04/06/2019   TRIG 78.0 04/06/2019   CHOLHDL 4 04/06/2019    --------------------------------------------------------------------------------------------------  ASSESSMENT AND PLAN: Yvonne Kendall, MD 02/20/2020 7:31 AM

## 2020-03-02 ENCOUNTER — Emergency Department
Admission: EM | Admit: 2020-03-02 | Discharge: 2020-03-02 | Disposition: A | Discharge status: Home / Self Care | Payer: BC Managed Care – PPO | Attending: Student in an Organized Health Care Education/Training Program | Admitting: Student in an Organized Health Care Education/Training Program

## 2020-03-02 ENCOUNTER — Other Ambulatory Visit: Payer: Self-pay

## 2020-03-02 ENCOUNTER — Emergency Department: Payer: BC Managed Care – PPO

## 2020-03-02 ENCOUNTER — Encounter: Payer: Self-pay | Admitting: Emergency Medicine

## 2020-03-02 DIAGNOSIS — Y929 Unspecified place or not applicable: Secondary | ICD-10-CM | POA: Diagnosis not present

## 2020-03-02 DIAGNOSIS — Y999 Unspecified external cause status: Secondary | ICD-10-CM | POA: Insufficient documentation

## 2020-03-02 DIAGNOSIS — S61411A Laceration without foreign body of right hand, initial encounter: Secondary | ICD-10-CM | POA: Insufficient documentation

## 2020-03-02 DIAGNOSIS — W010XXA Fall on same level from slipping, tripping and stumbling without subsequent striking against object, initial encounter: Secondary | ICD-10-CM | POA: Insufficient documentation

## 2020-03-02 DIAGNOSIS — Y939 Activity, unspecified: Secondary | ICD-10-CM | POA: Insufficient documentation

## 2020-03-02 DIAGNOSIS — I2510 Atherosclerotic heart disease of native coronary artery without angina pectoris: Secondary | ICD-10-CM | POA: Diagnosis not present

## 2020-03-02 DIAGNOSIS — Z79899 Other long term (current) drug therapy: Secondary | ICD-10-CM | POA: Diagnosis not present

## 2020-03-02 DIAGNOSIS — I10 Essential (primary) hypertension: Secondary | ICD-10-CM | POA: Diagnosis not present

## 2020-03-02 MED ORDER — TETANUS-DIPHTH-ACELL PERTUSSIS 5-2.5-18.5 LF-MCG/0.5 IM SUSP
0.5000 mL | Freq: Once | INTRAMUSCULAR | Status: DC
  Filled 2020-03-02: qty 0.5

## 2020-03-02 MED ORDER — LIDOCAINE HCL (PF) 1 % IJ SOLN
10.0000 mL | Freq: Once | INTRAMUSCULAR | Status: AC
  Administered 2020-03-02: 10 mL

## 2020-03-02 NOTE — ED Notes (Signed)
Buddy tape completed by JB.

## 2020-03-02 NOTE — ED Notes (Signed)
See triage note. Provider JB at bedside. Pt sitting calmly in bed. Family at bedside.

## 2020-03-02 NOTE — ED Provider Notes (Signed)
[facility] Emergency Department Provider Note ____________________________________________  Time seen: 48  I have reviewed the triage vital signs and the nursing notes.  HISTORY  Chief Complaint  Laceration  HPI Raymond Woods is a 54 y.o. right-handed male presents to the ED accompanied by his wife for evaluation and treatment of a accidental hand laceration and contusion. He describes falling on an outstretched had onto the concrete driveway. He sustained a laceration to the palmar MCP of the 4th and 5th digits. He denies any other injury at this time. He reports a current tetanus status as of 2017.   Past Medical History:  Diagnosis Date  . Fatty liver   . Hypertension     Patient Active Problem List   Diagnosis Date Noted  . Morbid obesity (HCC) 08/17/2019  . FH: CAD (coronary artery disease) 04/09/2018  . Advance care planning 03/14/2015  . Fatty liver 03/03/2012  . Routine general medical examination at a health care facility 12/20/2010  . Overweight 09/02/2008  . SNORING 09/02/2008  . Essential hypertension 05/02/2007    Past Surgical History:  Procedure Laterality Date  . ESOPHAGOGASTRODUODENOSCOPY     bx negative stricture secondary reflux, Barretts neagtive 03/20/02, + H Pylori, ABD U/S negative, fatty liver, left kidney prom soft tissue-CT 01/27/2006, CT  abd/pelvis dromedary hump of kidney 02/03/06  . HERNIA REPAIR     40 weeks old    Prior to Admission medications   Medication Sig Start Date End Date Taking? Authorizing Provider  amLODipine (NORVASC) 10 MG tablet Take 1 tablet (10 mg total) by mouth daily. 08/16/19 11/14/19  End, Cristal Deer, MD  calcium carbonate-magnesium hydroxide (ROLAIDS) 334 MG CHEW Chew 1 tablet by mouth as needed.     [provider]  fish oil-omega-3 fatty acids 1000 MG capsule Take 2 g by mouth daily.      [provider]  metoprolol succinate (TOPROL-XL) 100 MG 24 hr tablet Take 1 tablet  (100 mg total) by mouth daily. Take with or immediately following a meal. 04/12/19 08/15/28  Joaquim Nam, MD  Multiple Vitamin (MULTIVITAMIN) tablet Take 1 tablet by mouth daily.      [provider]    Allergies Patient has no known allergies.  Family History  Problem Relation Age of Onset  . Heart attack Father 29  . Coronary artery disease Father   . Hypertension Father   . COPD Father   . Asthma Father   . Heart disease Father   . Diverticulitis Brother        48  . Colitis Brother   . Hypertension Mother   . Coronary artery disease Paternal Grandfather   . Diabetes Other        cousins  . Stroke Paternal Grandmother   . Stroke Maternal Grandmother   . Prostate cancer Neg Hx   . Colon cancer Neg Hx     Social History Social History   Tobacco Use  . Smoking status: Never Smoker  . Smokeless tobacco: Current User    Types: Chew  Vaping Use  . Vaping Use: Never used  Substance Use Topics  . Alcohol use: Yes    Comment: 2 drinks per night  . Drug use: No    Review of Systems  Constitutional: Negative for fever. Cardiovascular: Negative for chest pain. Respiratory: Negative for shortness of breath. Musculoskeletal: Negative for back pain. Right palm laceration as above. Skin: Negative for rash. Neurological: Negative for headaches, focal weakness or numbness. ____________________________________________  PHYSICAL EXAM:  VITAL SIGNS: ED Triage Vitals  Enc Vitals Group     BP 03/02/20 1301 (!) 161/87     Pulse Rate 03/02/20 1301 92     Resp 03/02/20 1301 20     Temp 03/02/20 1301 98.4 F (36.9 C)     Temp Source 03/02/20 1301 Oral     SpO2 03/02/20 1301 98 %     Weight 03/02/20 1302 240 lb (108.9 kg)     Height 03/02/20 1302 5\' 9"  (1.753 m)     Head Circumference --      Peak Flow --      Pain Score 03/02/20 1306 4     Pain Loc --      Pain Edu? --      Excl. in GC? --     Constitutional: Alert and oriented. Well appearing and in no  distress. Head: Normocephalic and atraumatic. Eyes: Conjunctivae are normal. Normal extraocular movements Cardiovascular: Normal rate, regular rhythm. Normal distal pulses. Respiratory: Normal respiratory effort. Musculoskeletal: normal composite fist on the right. Patient with linear lacerations to the base of the proximal phalanges of the 4th and 5th digits. No deformity or tendon dysfunction. Nontender with normal range of motion in all extremities.  Neurologic: normal intrinsic and opposition testing noted. Normal gross sensation. Normal speech and language. No gross focal neurologic deficits are appreciated. Skin:  Skin is warm, dry and intact. No rash noted. ____________________________________________   RADIOLOGY  DG Right Hand  IMPRESSION: No acute osseous abnormality. ____________________________________________  PROCEDURES  .08/17/21Laceration Repair  Date/Time: 03/02/2020 3:23 PM Performed by: 03/04/2020, PA-C Authorized by: Lissa Hoard, PA-C   Consent:    Consent obtained:  Verbal   Consent given by:  Patient   Risks discussed:  Pain and poor wound healing Anesthesia (see MAR for exact dosages):    Anesthesia method:  Local infiltration   Local anesthetic:  Lidocaine 1% w/o epi Laceration details:    Location:  Hand   Hand location:  R palm   Wound length (cm): 4th MCP 2.5 & 5th MCP 3.5.   Depth (mm):  4 Repair type:    Repair type:  Simple Pre-procedure details:    Preparation:  Patient was prepped and draped in usual sterile fashion Exploration:    Contaminated: no   Treatment:    Area cleansed with:  Betadine and saline   Amount of cleaning:  Standard   Irrigation solution:  Sterile saline   Irrigation method:  Syringe Skin repair:    Repair method:  Sutures   Suture size:  5-0   Suture material:  Nylon   Suture technique:  Simple interrupted   Number of sutures:  15 Approximation:    Approximation:  Close Post-procedure  details:    Dressing:  Non-adherent dressing and bulky dressing   Patient tolerance of procedure:  Tolerated well, no immediate complications   ____________________________________________  INITIAL IMPRESSION / ASSESSMENT AND PLAN / ED COURSE  DDX: hand laceration, finger laceration, tendon injury, finger dislocation  Patient with ED evaluation of right palmar lacerations after a mechanical fall. He has not radiologic evidence of fracture or dislocation. His wounds are repaired with sutures and wound care supplies & instructions are provided. He will see his provider in 10-12 days for suture removal.  KHYLIN GUTRIDGE was evaluated in Emergency Department on 03/02/2020 for the symptoms described in the history of present illness. He was evaluated in the context of the global  COVID-19 pandemic, which necessitated consideration that the patient might be at risk for infection with the SARS-CoV-2 virus that causes COVID-19. Institutional protocols and algorithms that pertain to the evaluation of patients at risk for COVID-19 are in a state of rapid change based on information released by regulatory bodies including the CDC and federal and state organizations. These policies and algorithms were followed during the patient's care in the ED. ____________________________________________  FINAL CLINICAL IMPRESSION(S) / ED DIAGNOSES  Final diagnoses:  Laceration of right hand without foreign body, initial encounter      Lissa Hoard, PA-C 03/02/20 1651    Willy Eddy, MD 03/02/20 1906

## 2020-03-02 NOTE — ED Triage Notes (Signed)
Pt to ED via POV stating that he tripped and fell and cut his right pinky and ring finger. Bleeding is controlled at this time. Pt is in NAD.

## 2020-03-02 NOTE — Discharge Instructions (Signed)
Keep the wounds clean, dry, and covered. Follow-up with your provider for suture removal in 10-12 days.  

## 2020-04-02 ENCOUNTER — Other Ambulatory Visit: Payer: Self-pay | Admitting: Family Medicine

## 2020-04-02 DIAGNOSIS — I10 Essential (primary) hypertension: Secondary | ICD-10-CM

## 2020-04-09 ENCOUNTER — Other Ambulatory Visit: Payer: Self-pay | Admitting: Family Medicine

## 2020-04-09 MED ORDER — METOPROLOL SUCCINATE ER 100 MG PO TB24: 100.0000 mg | ORAL_TABLET | Freq: Every day | ORAL | 0 refills | Status: DC

## 2020-04-09 NOTE — Telephone Encounter (Signed)
Pt called stating he is out of metoprolol and needs refill  [facility]  bp running 150/90  Sent triage

## 2020-04-09 NOTE — Telephone Encounter (Signed)
Carrie at front desk said that access nurse called to have someone from our office contact pt about metoprolol refill. I spoke with pt; pts last annual was 04/12/19 and pt is already scheduled with Dr Para March for CPX on 04/17/20. Pt was very nice and polite and I apologized for any confusion about getting his med refilled. Pt voiced understanding that metoprolol 100 mg # 90 was sent [facility] to [facility] [place] and pt was appreciative and will keep appt with Dr Para March and will get refill updated for a yr at the CPX. Pt said he did not need anything else and was appreciative for my help.

## 2020-04-10 ENCOUNTER — Other Ambulatory Visit (INDEPENDENT_AMBULATORY_CARE_PROVIDER_SITE_OTHER): Payer: BC Managed Care – PPO

## 2020-04-10 ENCOUNTER — Other Ambulatory Visit: Payer: Self-pay

## 2020-04-10 DIAGNOSIS — I10 Essential (primary) hypertension: Secondary | ICD-10-CM | POA: Diagnosis not present

## 2020-04-10 LAB — COMPREHENSIVE METABOLIC PANEL
ALT: 27 U/L (ref 0–53)
AST: 19 U/L (ref 0–37)
Albumin: 4.4 g/dL (ref 3.5–5.2)
Alkaline Phosphatase: 68 U/L (ref 39–117)
BUN: 10 mg/dL (ref 6–23)
CO2: 26 mEq/L (ref 19–32)
Calcium: 9 mg/dL (ref 8.4–10.5)
Chloride: 105 mEq/L (ref 96–112)
Creatinine, Ser: 0.84 mg/dL (ref 0.40–1.50)
GFR: 95.74 mL/min (ref >60.00)
Glucose, Bld: 93 mg/dL (ref 70–99)
Potassium: 4.4 mEq/L (ref 3.5–5.1)
Sodium: 140 mEq/L (ref 135–145)
Total Bilirubin: 0.6 mg/dL (ref 0.2–1.2)
Total Protein: 7 g/dL (ref 6.0–8.3)

## 2020-04-10 LAB — LIPID PANEL
Cholesterol: 171 mg/dL (ref 0–200)
HDL: 60.7 mg/dL (ref >39.00)
LDL Cholesterol: 91 mg/dL (ref 0–99)
NonHDL: 109.95
Total CHOL/HDL Ratio: 3
Triglycerides: 97 mg/dL (ref 0.0–149.0)
VLDL: 19.4 mg/dL (ref 0.0–40.0)

## 2020-04-10 NOTE — Telephone Encounter (Signed)
Med was refilled on 04/09/20 and pt is aware.

## 2020-04-10 NOTE — Telephone Encounter (Signed)
[facility] Day - Client TELEPHONE ADVICE RECORD AccessNurse Patient Name: Raymond Woods Gender: Male DOB: 1966/03/27 Age: 54 Y 7 M 3 D Return Phone Number: [phone] (Primary), [phone] (Secondary) Address: City/State/Zip: [place] [address] Client [facility] Day - Client Client Site [facility] - Day Physician Raechel Ache - MD Contact Type Call Who Is Calling Patient / Member / Family / Caregiver Call Type Triage / Clinical Relationship To Patient Self Return Phone Number [phone] (Primary) Chief Complaint Blood Pressure High Reason for Call Symptomatic / Request for Health Information Initial Comment Caller states his blood pressure is 150/90. He is out of his medication. Translation No No Triage Reason Patient declined Nurse Assessment Nurse: [name], RN, [name] Date/Time [name] Time): 04/09/2020 3:34:22 PM Confirm and document reason for call. If symptomatic, describe symptoms. ---Caller states that he needs his B/P meds refilled. Advised that he is out of his metoprolol. States that he has been out for over a month. Advised that he found a few left over and took one, current B/P is 135/73. caller did not want to answer questions. Advised that he wanted his meds filled and wanted to pick up today on his way home. Does the patient have any new or worsening symptoms? ---Yes Will a triage be completed? ---No Select reason for no triage. ---Patient declined Disp. Time [name] Time) Disposition Final User 04/09/2020 3:20:33 PM Attempt made - message left [name], RN, [name] 04/09/2020 3:37:08 PM Clinical Call Yes [name], RN, [name] Comments User: [name], RN Date/Time [name] Time): 04/09/2020 3:36:56 PM Spoke with [name] at the back line and she took the message and advised that she will have the nurse call him back.

## 2020-04-17 ENCOUNTER — Other Ambulatory Visit: Payer: Self-pay

## 2020-04-17 ENCOUNTER — Encounter: Payer: Self-pay | Admitting: Family Medicine

## 2020-04-17 ENCOUNTER — Ambulatory Visit (INDEPENDENT_AMBULATORY_CARE_PROVIDER_SITE_OTHER): Payer: BC Managed Care – PPO | Admitting: Family Medicine

## 2020-04-17 VITALS — BP 140/84 | HR 51 | Temp 97.0°F | Ht 68.25 in | Wt 244.0 lb

## 2020-04-17 DIAGNOSIS — Z7189 Other specified counseling: Secondary | ICD-10-CM

## 2020-04-17 DIAGNOSIS — Z0000 Encounter for general adult medical examination without abnormal findings: Secondary | ICD-10-CM | POA: Diagnosis not present

## 2020-04-17 DIAGNOSIS — I10 Essential (primary) hypertension: Secondary | ICD-10-CM

## 2020-04-17 DIAGNOSIS — Z1211 Encounter for screening for malignant neoplasm of colon: Secondary | ICD-10-CM

## 2020-04-17 MED ORDER — AMLODIPINE BESYLATE 10 MG PO TABS: 10.0000 mg | ORAL_TABLET | Freq: Every day | ORAL | 3 refills | Status: DC

## 2020-04-17 MED ORDER — METOPROLOL SUCCINATE ER 100 MG PO TB24: 100.0000 mg | ORAL_TABLET | Freq: Every day | ORAL | 3 refills | Status: DC

## 2020-04-17 NOTE — Progress Notes (Signed)
This visit occurred during the SARS-CoV-2 public health emergency.  Safety protocols were in place, including screening questions prior to the visit, additional usage of staff PPE, and extensive cleaning of exam room while observing appropriate contact time as indicated for disinfecting solutions.  CPE- See plan.  Routine anticipatory guidance given to patient.  See health maintenance.  The possibility exists that previously documented standard health maintenance information may have been brought forward from a previous encounter into this note.  If needed, that same information has been updated to reflect the current situation based on today's encounter.    Tetanus 2017. PNA and shingles not due yet, d/w pt.  Flu shot encouraged. D/w pt.  covid vaccine d/w pt. he is not yet vaccinated.  Encouraged to get vaccinated. Prostate cancer screening and PSA options(with potential risks and benefits of testing vs not testing) were discussed along with recent recs/guidelines. He declined testing PSAat this point. D/w patient [id number] for colon cancer screening, including IFOB vs. colonoscopy.  Risks and benefits of both were discussed and patient voiced understanding.  Pt elects for: cologuard.  He did not turn it in from before.  Reordered. Living will d/w pt. Wife designated if patient were incapacitated.  Diet and exercise d/w pt.  HIV testing d/w pt. Per patient, was neg on testing with outside labs ~2014. D/w pt about dip tobacco, encouraged cessation.He is considering options.  Hypertension:    Using medication without problems or lightheadedness: yes Chest pain with exertion:no Edema:rare, noted with prolonged standing when he took a double dose of amlodipine.  Cautions d/w pt. advised not to double up on amlodipine. Short of breath:no Labs d/w pt.    PMH and SH reviewed  Meds, vitals, and allergies reviewed.   ROS: Per HPI.  Unless specifically indicated otherwise in HPI, the patient  denies:  General: fever. Eyes: acute vision changes ENT: sore throat Cardiovascular: chest pain Respiratory: SOB GI: vomiting GU: dysuria Musculoskeletal: acute back pain Derm: acute rash Neuro: acute motor dysfunction Psych: worsening mood Endocrine: polydipsia Heme: bleeding Allergy: hayfever  GEN: nad, alert and oriented HEENT: ncat NECK: supple w/o LA CV: rrr. PULM: ctab, no inc wob ABD: soft, +bs EXT: no edema SKIN: no acute rash

## 2020-04-17 NOTE — Patient Instructions (Signed)
Don't change your meds for now.  Update me as needed.   I would get a flu shot each fall.   I would get a covid shot first.   Take care.  Glad to see you.

## 2020-04-21 NOTE — Assessment & Plan Note (Signed)
No change in meds.  Continue work on diet and exercise.  Needs gradual weight reduction.  See above.

## 2020-04-21 NOTE — Assessment & Plan Note (Signed)
Tetanus 2017. PNA and shingles not due yet, d/w pt.  Flu shot encouraged. D/w pt.  covid vaccine d/w pt. he is not yet vaccinated.  Encouraged to get vaccinated. Prostate cancer screening and PSA options(with potential risks and benefits of testing vs not testing) were discussed along with recent recs/guidelines. He declined testing PSAat this point. D/w patient AL:[id number] for colon cancer screening, including IFOB vs. colonoscopy.  Risks and benefits of both were discussed and patient voiced understanding.  Pt elects for: cologuard.   Living will d/w pt. Wife designated if patient were incapacitated.  Diet and exercise d/w pt.  HIV testing d/w pt. Per patient, was neg on testing with outside labs ~2014. D/w pt about dip tobacco, encouraged cessation.He is considering options.

## 2020-04-21 NOTE — Assessment & Plan Note (Signed)
Living will d/w pt.  Wife designated if patient were incapacitated.   ?

## 2020-04-23 ENCOUNTER — Other Ambulatory Visit: Payer: Self-pay

## 2020-04-23 ENCOUNTER — Encounter: Payer: Self-pay | Admitting: Internal Medicine

## 2020-04-23 ENCOUNTER — Ambulatory Visit (INDEPENDENT_AMBULATORY_CARE_PROVIDER_SITE_OTHER): Payer: BC Managed Care – PPO | Admitting: Internal Medicine

## 2020-04-23 VITALS — BP 146/72 | HR 77 | Ht 68.5 in | Wt 249.0 lb

## 2020-04-23 DIAGNOSIS — I10 Essential (primary) hypertension: Secondary | ICD-10-CM | POA: Diagnosis not present

## 2020-04-23 DIAGNOSIS — Z79899 Other long term (current) drug therapy: Secondary | ICD-10-CM | POA: Diagnosis not present

## 2020-04-23 MED ORDER — HYDROCHLOROTHIAZIDE 25 MG PO TABS: 25.0000 mg | ORAL_TABLET | Freq: Every day | ORAL | 2 refills | Status: DC

## 2020-04-23 MED ORDER — AMLODIPINE BESYLATE 5 MG PO TABS: 5.0000 mg | ORAL_TABLET | Freq: Every day | ORAL | 2 refills | Status: DC

## 2020-04-23 NOTE — Progress Notes (Signed)
Follow-up Outpatient Visit Date: 04/23/2020  Primary Care Provider: Joaquim Nam, MD [address] [place] [address]  Chief Complaint: Follow-up hypertension  HPI:  Raymond Woods is a 54 y.o. male with history of hypertension and hepatic steatosis, who presents for follow-up of hypertension.  I last saw Raymond Woods in January, which time he was doing relatively well.  He noted some chronic fatigue.  We discussed sleep evaluation but agreed to defer this.  Amlodipine was also increased to 10 mg daily due to suboptimal blood pressure control.  About 5 to 6 months ago, Raymond Woods began noticing worsening leg edema, especially towards the Brandilee Pies of the day.  He notes that his blood pressure has been normal to mildly elevated, with readings before taking his medications in the evening usually in the 130-150 systolic range.  He has not been exercising regularly but has been trying to remain active at work.  He has tried to work on his diet and has lost some weight since January.  Raymond Woods continues to have some fatigue especially towards the Thamar Holik of the day.  He does not fall asleep or nap during the day.  He denies chest pain, shortness of breath, palpitations, lightheadedness, and orthopnea.Marland Kitchen  --------------------------------------------------------------------------------------------------  Cardiovascular History & Procedures: Cardiovascular Problems:  Family history of coronary artery disease  Risk Factors:  Hypertension, male gender, and obesity  Cath/PCI:  None  CV Surgery:  None  EP Procedures and Devices:  None  Non-Invasive Evaluation(s):  None  Recent CV Pertinent Labs: Lab Results  Component Value Date   CHOL 171 04/10/2020   HDL 60.70 04/10/2020   LDLCALC 91 04/10/2020   TRIG 97.0 04/10/2020   CHOLHDL 3 04/10/2020   K 4.4 04/10/2020   BUN 10 04/10/2020   CREATININE 0.84 04/10/2020    Past medical and surgical history  were reviewed and updated in EPIC.  Current Meds  Medication Sig  . amLODipine (NORVASC) 10 MG tablet Take 1 tablet (10 mg total) by mouth daily.  . calcium carbonate-magnesium hydroxide (ROLAIDS) 334 MG CHEW Chew 1 tablet by mouth as needed.   . fish oil-omega-3 fatty acids 1000 MG capsule Take 2 g by mouth daily.    . metoprolol succinate (TOPROL-XL) 100 MG 24 hr tablet Take 1 tablet (100 mg total) by mouth daily. Take with or immediately following a meal.  . Multiple Vitamin (MULTIVITAMIN) tablet Take 1 tablet by mouth daily.      Allergies: Patient has no known allergies.  Social History   Tobacco Use  . Smoking status: Never Smoker  . Smokeless tobacco: Current User    Types: Chew  Vaping Use  . Vaping Use: Never used  Substance Use Topics  . Alcohol use: Yes    Comment: 2 drinks per night  . Drug use: No    Family History  Problem Relation Age of Onset  . Heart attack Father 28  . Coronary artery disease Father   . Hypertension Father   . COPD Father   . Asthma Father   . Heart disease Father   . Diverticulitis Brother        14  . Colitis Brother   . Hypertension Mother   . Coronary artery disease Paternal Grandfather   . Diabetes Other        cousins  . Stroke Paternal Grandmother   . Stroke Maternal Grandmother   . Prostate cancer Neg Hx   . Colon cancer Neg Hx  Review of Systems: A 12-system review of systems was performed and was negative except as noted in the HPI.  --------------------------------------------------------------------------------------------------  Physical Exam: BP (!) 146/72 (BP Location: Left Arm, Patient Position: Sitting, Cuff Size: Normal)   Pulse 77   Ht 5' 8.5" (1.74 m)   Wt 249 lb (112.9 kg)   SpO2 97%   BMI 37.31 kg/m   General: NAD. HEENT: No conjunctival pallor or scleral icterus. Facemask in place. Neck: Supple without lymphadenopathy, thyromegaly, JVD, or HJR. Lungs: Normal work of breathing. Clear to  auscultation bilaterally without wheezes or crackles. Heart: Regular rate and rhythm without murmurs, rubs, or gallops. Abd: Bowel sounds present. Soft, NT/ND. Ext: 1+ pitting edema to the mid calves.  EKG: Normal sinus rhythm without significant abnormality.  Lab Results  Component Value Date   WBC 5.4 11/27/2008   HGB 15.7 11/27/2008   HCT 44.0 11/27/2008   MCV 91.6 11/27/2008   PLT 187.0 11/27/2008    Lab Results  Component Value Date   NA 140 04/10/2020   K 4.4 04/10/2020   CL 105 04/10/2020   CO2 26 04/10/2020   BUN 10 04/10/2020   CREATININE 0.84 04/10/2020   GLUCOSE 93 04/10/2020   ALT 27 04/10/2020    Lab Results  Component Value Date   CHOL 171 04/10/2020   HDL 60.70 04/10/2020   LDLCALC 91 04/10/2020   TRIG 97.0 04/10/2020   CHOLHDL 3 04/10/2020    --------------------------------------------------------------------------------------------------  ASSESSMENT AND PLAN: Hypertension: Blood pressure suboptimally controlled today despite escalation of amlodipine at our last visit.  Unfortunately, Raymond Woods has developed some leg edema, most likely due to amlodipine.  We have agreed to decrease amlodipine to 5 mg daily and add HCTZ 25 mg daily.  We will plan to repeat a basic metabolic panel in about a month.  I encouraged him to continue to work on diet and exercise, including sodium restriction.  We have agreed to defer sleep evaluation for now.  He should continue to monitor his blood pressure at home and let us know if it is consistently above 140/90.  Follow-up: Return to clinic in 6 months.  Yvonne Kendall, MD 04/23/2020 4:20 PM

## 2020-04-23 NOTE — Patient Instructions (Signed)
Medication Instructions:  Your physician has recommended you make the following change in your medication:  1- DECREASE Amlodipine to 5 mg by mouth once a day. 2- START Hydrochlorothiazide 25 mg (1 Tablet) by mouth once a day.  *If you need a refill on your cardiac medications before your next appointment, please call your pharmacy*  Lab Work: Your physician recommends that you return for lab work in: 1 MONTH at the [facility] to check BMET.  - Please go to the [facility]. You will check in at the front desk to the right as you walk into the atrium. Valet Parking is offered if needed. - No appointment needed. You may go any day between 7 am and 6 pm.  If you have labs (blood work) drawn today and your tests are completely normal, you will receive your results only by: Marland Kitchen MyChart Message (if you have MyChart) OR . A paper copy in the mail If you have any lab test that is abnormal or we need to change your treatment, we will call you to review the results.   Testing/Procedures: none  Follow-Up: At [facility], you and your health needs are our priority.  As part of our continuing mission to provide you with exceptional heart care, we have created designated Provider Care Teams.  These Care Teams include your primary Cardiologist (physician) and Advanced Practice Providers (APPs -  Physician Assistants and Nurse Practitioners) who all work together to provide you with the care you need, when you need it.  We recommend signing up for the patient portal called "MyChart".  Sign up information is provided on this After Visit Summary.  MyChart is used to connect with patients for Virtual Visits (Telemedicine).  Patients are able to view lab/test results, encounter notes, upcoming appointments, etc.  Non-urgent messages can be sent to your provider as well.   To learn more about what you can do with MyChart, go to [email or web address].    Your next appointment:   6 month(s)  The  format for your next appointment:   In Person  Provider:   You may see DR Cristal Deer END or one of the following Advanced Practice Providers on your designated Care Team:    Nicolasa Ducking, NP  Eula Listen, PA-C  Marisue Ivan, PA-C  Cadence Gates, [place]

## 2020-07-10 ENCOUNTER — Telehealth: Payer: Self-pay | Admitting: Internal Medicine

## 2020-07-10 ENCOUNTER — Telehealth: Payer: Self-pay | Admitting: Family Medicine

## 2020-07-10 MED ORDER — AMLODIPINE BESYLATE 5 MG PO TABS: 5.0000 mg | ORAL_TABLET | Freq: Every day | ORAL | 0 refills | Status: DC

## 2020-07-10 MED ORDER — HYDROCHLOROTHIAZIDE 25 MG PO TABS: 25.0000 mg | ORAL_TABLET | Freq: Every day | ORAL | 0 refills | Status: DC

## 2020-07-10 NOTE — Telephone Encounter (Signed)
PT CALLED IN WANTED TO KNOW ABOUT GETTING TOPTOL,

## 2020-07-10 NOTE — Telephone Encounter (Signed)
*  STAT* If patient is at the pharmacy, call can be transferred to refill team.   1. Which medications need to be refilled? (please list name of each medication and dose if known)   hctz 25 mg po q d  Amlodipine 5 mg po q d   2. Which pharmacy/location (including street and city if local pharmacy) is medication to be sent to?  [facility] [address]   3. Do they need a 30 day or 90 day supply? 90

## 2020-07-10 NOTE — Telephone Encounter (Signed)
Called patient and informed him that a six month supply of his metoprolol was prescribed on 04/17/2020, so he should have enough to last him. Instructed patient to call pharmacy for refills. Patient verbalized understanding.

## 2020-07-10 NOTE — Telephone Encounter (Signed)
Requested Prescriptions   Signed Prescriptions Disp Refills   amLODipine (NORVASC) 5 MG tablet 90 tablet 0    Sig: Take 1 tablet (5 mg total) by mouth daily.    Authorizing Provider: END, CHRISTOPHER    Ordering User: Shawnie Dapper, Bionca Mckey C   hydrochlorothiazide (HYDRODIURIL) 25 MG tablet 90 tablet 0    Sig: Take 1 tablet (25 mg total) by mouth daily.    Authorizing Provider: END, CHRISTOPHER    Ordering User: Kendrick Fries

## 2020-08-26 ENCOUNTER — Other Ambulatory Visit: Payer: Self-pay

## 2020-08-26 ENCOUNTER — Emergency Department (HOSPITAL_COMMUNITY)
Admission: EM | Admit: 2020-08-26 | Discharge: 2020-08-26 | Disposition: A | Discharge status: Home / Self Care | Payer: BC Managed Care – PPO | Attending: Emergency Medicine | Admitting: Emergency Medicine

## 2020-08-26 ENCOUNTER — Emergency Department (HOSPITAL_COMMUNITY): Payer: BC Managed Care – PPO

## 2020-08-26 ENCOUNTER — Other Ambulatory Visit
Admission: RE | Admit: 2020-08-26 | Discharge: 2020-08-26 | Disposition: A | Discharge status: Home / Self Care | Payer: BC Managed Care – PPO | Attending: Internal Medicine | Admitting: Internal Medicine

## 2020-08-26 ENCOUNTER — Encounter (HOSPITAL_COMMUNITY): Payer: Self-pay | Admitting: Emergency Medicine

## 2020-08-26 ENCOUNTER — Telehealth: Payer: Self-pay

## 2020-08-26 DIAGNOSIS — F17220 Nicotine dependence, chewing tobacco, uncomplicated: Secondary | ICD-10-CM | POA: Diagnosis not present

## 2020-08-26 DIAGNOSIS — R319 Hematuria, unspecified: Secondary | ICD-10-CM | POA: Insufficient documentation

## 2020-08-26 DIAGNOSIS — Z79899 Other long term (current) drug therapy: Secondary | ICD-10-CM | POA: Diagnosis present

## 2020-08-26 DIAGNOSIS — I10 Essential (primary) hypertension: Secondary | ICD-10-CM | POA: Diagnosis present

## 2020-08-26 LAB — BASIC METABOLIC PANEL
Anion gap: 10 (ref 5–15)
Anion gap: 11 (ref 5–15)
BUN: 9 mg/dL (ref 6–20)
BUN: 7 mg/dL (ref 6–20)
CO2: 26 mmol/L (ref 22–32)
CO2: 25 mmol/L (ref 22–32)
Calcium: 9.3 mg/dL (ref 8.9–10.3)
Calcium: 9.1 mg/dL (ref 8.9–10.3)
Chloride: 99 mmol/L (ref 98–111)
Chloride: 99 mmol/L (ref 98–111)
Creatinine, Ser: 0.74 mg/dL (ref 0.61–1.24)
Creatinine, Ser: 0.86 mg/dL (ref 0.61–1.24)
GFR, Estimated: >60 mL/min (ref >60)
GFR, Estimated: >60 mL/min (ref >60)
Glucose, Bld: 119 mg/dL — ABNORMAL HIGH (ref 70–99)
Glucose, Bld: 97 mg/dL (ref 70–99)
Potassium: 4.2 mmol/L (ref 3.5–5.1)
Potassium: 3.7 mmol/L (ref 3.5–5.1)
Sodium: 135 mmol/L (ref 135–145)
Sodium: 135 mmol/L (ref 135–145)

## 2020-08-26 LAB — CBC
HCT: 46.1 % (ref 39.0–52.0)
Hemoglobin: 15.5 g/dL (ref 13.0–17.0)
MCH: 32.2 pg (ref 26.0–34.0)
MCHC: 33.6 g/dL (ref 30.0–36.0)
MCV: 95.8 fL (ref 80.0–100.0)
Platelets: 208 10*3/uL (ref 150–400)
RBC: 4.81 MIL/uL (ref 4.22–5.81)
RDW: 12.3 % (ref 11.5–15.5)
WBC: 6.4 10*3/uL (ref 4.0–10.5)
nRBC: 0 % (ref 0.0–0.2)

## 2020-08-26 LAB — URINALYSIS, ROUTINE W REFLEX MICROSCOPIC
Bilirubin Urine: NEGATIVE
Glucose, UA: NEGATIVE mg/dL
Hgb urine dipstick: NEGATIVE
Ketones, ur: NEGATIVE mg/dL
Leukocytes,Ua: NEGATIVE
Nitrite: NEGATIVE
Protein, ur: NEGATIVE mg/dL
Specific Gravity, Urine: 1.005 (ref 1.005–1.030)
pH: 7 (ref 5.0–8.0)

## 2020-08-26 NOTE — Telephone Encounter (Signed)
[name] Day - Client TELEPHONE ADVICE RECORD [facility] Patient Name: Raymond Woods Gender: Male DOB: Sep 29, 1965 Age: 55 Y 11 M 20 D Return Phone Number: [phone] (Primary), [phone] (Secondary) Address: City/State/Zip: [place] [address] Client [facility] Day - Client Client Site [facility] - Day Physician Raechel Ache - MD Contact Type Call Who Is Calling Patient / Member / Family / Caregiver Call Type Triage / Clinical Relationship To Patient Self Return Phone Number [phone] (Primary) Chief Complaint Urine, Blood In Reason for Call Symptomatic / Request for Health Information Initial Comment Caller said he is having blood in urine, blood after urination, and after a bowel movement. Caller said he has some pain after he finishes going. Caller said he doesn`t bleed constantly. GOTO Facility Not Listed Closest ER Translation No Nurse Assessment Nurse: Charna Elizabeth, RN, Cathy Date/Time (Eastern Time): 08/26/2020 11:47:42 AM Confirm and document reason for call. If symptomatic, describe symptoms. ---Windy Fast states that he developed blood in his urine and after passing urine about a week ago. No fever. No injury in the past 10 days. Alert and responsive. Does the patient have any new or worsening symptoms? ---Yes Will a triage be completed? ---Yes Related visit to physician within the last 2 weeks? ---Yes Does the PT have any chronic conditions? (i.e. diabetes, asthma, this includes High risk factors for pregnancy, etc.) ---Yes List chronic conditions. ---High Blood Pressure, COVID about 3 weeks ago Is this a behavioral health or substance abuse call? ---No Guidelines Guideline Title Affirmed Question Affirmed Notes Nurse Date/Time (Eastern Time) Urine - Blood In Passing pure blood or large blood clots (i.e., size > a dime) (Exception: fleck or small strands) [place], RN, [facility] 08/26/2020  11:49:58 AM Disp. Time [name] Time) Disposition Final User 08/26/2020 11:52:19 AM Go to ED Now Yes Charna Elizabeth, RN, Lynden Ang PLEASE NOTE: All timestamps contained within this report are represented as [place] Standard Time. CONFIDENTIALTY NOTICE: This fax transmission is intended only for the addressee. It contains information that is legally privileged, confidential or otherwise protected from use or disclosure. If you are not the intended recipient, you are strictly prohibited from reviewing, disclosing, copying using or disseminating any of this information or taking any action in reliance on or regarding this information. If you have received this fax in error, please notify us immediately by telephone so that we can arrange for its return to [place]. Phone: [phone], Toll-Free: [phone], Fax: [phone] Page: 2 of 2 Call Id: [id number] Caller Disagree/Comply Comply Caller Understands Yes PreDisposition Call Doctor Care Advice Given Per Guideline GO TO ED NOW: * You need to be seen in the Emergency Department. * Go to the ED at ___________ Hospital. * Leave now. Drive carefully. ANOTHER ADULT SHOULD DRIVE: * It is better and safer if another adult drives instead of you. BRING MEDICINES: * Bring a list of your current medicines when you go to the Emergency Department (ER). * Bring the pill bottles too. This will help the doctor (or NP/PA) to make certain you are taking the right medicines and the right dose. CARE ADVICE given per Urine, Blood In (Adult) guideline. Referrals GO TO FACILITY OTHER - SPECIFY

## 2020-08-26 NOTE — Discharge Instructions (Signed)
Please return for any problem.  Follow-up with your regular doctor as instructed.  Further work-up and outpatient follow-up with urology (Dr. Marlou Porch) is appropriate.  If you develop fever, worsening bleeding, pain, other complaint please seek medical treatment.

## 2020-08-26 NOTE — Telephone Encounter (Signed)
I spoke with pt; pt said it is not a steady stream of blood; when seen bright red and dark blood; on and off abd pain and feeling bloated. Pt had covid 3 wks ago. Pt is on his way to [facility] ED now; 911 declined; pt said wonders if passed a kidney stone. Advised pt to go to ED for eval and needed testing. Pt voiced understanding and is on his way to [facility] ED now. Sending note to Dr Para March.

## 2020-08-26 NOTE — Telephone Encounter (Signed)
Will await ER note.  Thanks.  

## 2020-08-26 NOTE — ED Triage Notes (Signed)
Pt reports bleeding from his urethra whenever he has a bowel movement began last Tuesday. Today he started having bleeding from urethra after urinating. Endorses some urinary frequency without pain as well as some lower back pain since having covid a few weeks ago. Denies blood thinners.

## 2020-08-26 NOTE — ED Provider Notes (Signed)
[facility] EMERGENCY DEPARTMENT Provider Note   CSN: [id number] Arrival date & time: 08/26/20  1314     History Chief Complaint  Patient presents with  . Hematuria    Raymond Woods is a 55 y.o. male.  55 year old male with prior medical history as detailed below presents for evaluation.  Patient reports intermittent blood in his urine for the last week.  Patient reports that his last noticed episode of hematuria was early this morning.  Patient reports that he only sees blood in his urine after a BM.  Patient denies bloody stools.  He denies blood with wiping after BM.  He typically will note passage of urine during his bowel movement.  After completing his bowel movement he will notice a small amount of blood at the meatus.  Patient denies abdominal pain or fever.  He denies problems with prior history of hematuria.  He denies back pain or fever.  Patient does report recent Covid infection that did not require hospitalization.  Patient denies a prior history of prostatic hypertrophy or prostate issues.  The history is provided by the patient and medical records.  Hematuria This is a new problem. The current episode started more than 1 week ago. The problem occurs constantly. The problem has not changed since onset.Pertinent negatives include no chest pain, no abdominal pain, no headaches and no shortness of breath. Nothing aggravates the symptoms. Nothing relieves the symptoms.       Past Medical History:  Diagnosis Date  . Fatty liver   . Hypertension     Patient Active Problem List   Diagnosis Date Noted  . Morbid obesity (HCC) 08/17/2019  . FH: CAD (coronary artery disease) 04/09/2018  . Advance care planning 03/14/2015  . Fatty liver 03/03/2012  . Routine general medical examination at a health care facility 12/20/2010  . Overweight 09/02/2008  . SNORING 09/02/2008  . Essential hypertension 05/02/2007    Past Surgical History:  Procedure  Laterality Date  . ESOPHAGOGASTRODUODENOSCOPY     bx negative stricture secondary reflux, Barretts neagtive 03/20/02, + H Pylori, ABD U/S negative, fatty liver, left kidney prom soft tissue-CT 01/27/2006, CT  abd/pelvis dromedary hump of kidney 02/03/06  . HERNIA REPAIR     53 weeks old       Family History  Problem Relation Age of Onset  . Heart attack Father 41  . Coronary artery disease Father   . Hypertension Father   . COPD Father   . Asthma Father   . Heart disease Father   . Diverticulitis Brother        14  . Colitis Brother   . Hypertension Mother   . Coronary artery disease Paternal Grandfather   . Diabetes Other        cousins  . Stroke Paternal Grandmother   . Stroke Maternal Grandmother   . Prostate cancer Neg Hx   . Colon cancer Neg Hx     Social History   Tobacco Use  . Smoking status: Never Smoker  . Smokeless tobacco: Current User    Types: Chew  Vaping Use  . Vaping Use: Never used  Substance Use Topics  . Alcohol use: Yes    Comment: 2 drinks per night  . Drug use: No    Home Medications Prior to Admission medications   Medication Sig Start Date End Date Taking? Authorizing Provider  amLODipine (NORVASC) 5 MG tablet Take 1 tablet (5 mg total) by mouth daily. Patient taking differently:  Take 5 mg by mouth at bedtime. 07/10/20 10/08/20 Yes End, Cristal Deer, MD  calcium carbonate-magnesium hydroxide (ROLAIDS) 334 MG CHEW Chew 1 tablet by mouth as needed (for heartburn).   Yes [provider]  fish oil-omega-3 fatty acids 1000 MG capsule Take 2 g by mouth 3 (three) times a week.   Yes [provider]  hydrochlorothiazide (HYDRODIURIL) 25 MG tablet Take 1 tablet (25 mg total) by mouth daily. Patient taking differently: Take 25 mg by mouth at bedtime. 07/10/20 10/08/20 Yes End, Cristal Deer, MD  metoprolol succinate (TOPROL-XL) 100 MG 24 hr tablet Take 1 tablet (100 mg total) by mouth daily. Take with or immediately following a  meal. Patient taking differently: Take 100 mg by mouth at bedtime. Take with or immediately following a meal. 04/17/20 07/16/20 Yes Joaquim Nam, MD  multivitamin (ONE-A-DAY MEN'S) TABS tablet Take 1 tablet by mouth 3 (three) times a week.   Yes [provider]    Allergies    Pine  Review of Systems   Review of Systems  Respiratory: Negative for shortness of breath.   Cardiovascular: Negative for chest pain.  Gastrointestinal: Negative for abdominal pain.  Genitourinary: Positive for hematuria.  Neurological: Negative for headaches.  All other systems reviewed and are negative.   Physical Exam Updated Vital Signs BP (!) 145/85   Pulse 80   Temp 98.3 F (36.8 C) (Oral)   Resp 18   Ht 5\' 9"  (1.753 m)   Wt 111.1 kg   SpO2 99%   BMI 36.18 kg/m   Physical Exam Vitals and nursing note reviewed.  Constitutional:      General: He is not in acute distress.    Appearance: Normal appearance. He is well-developed and well-nourished.  HENT:     Head: Normocephalic and atraumatic.     Mouth/Throat:     Mouth: Oropharynx is clear and moist.  Eyes:     Extraocular Movements: EOM normal.     Conjunctiva/sclera: Conjunctivae normal.     Pupils: Pupils are equal, round, and reactive to light.  Cardiovascular:     Rate and Rhythm: Normal rate and regular rhythm.     Heart sounds: Normal heart sounds.  Pulmonary:     Effort: Pulmonary effort is normal. No respiratory distress.     Breath sounds: Normal breath sounds.  Abdominal:     General: There is no distension.     Palpations: Abdomen is soft.     Tenderness: There is no abdominal tenderness.  Genitourinary:    Penis: Normal.      Testes: Normal.     Comments: No penile lesion or appreciable discharge. Musculoskeletal:        General: No deformity or edema. Normal range of motion.     Cervical back: Normal range of motion and neck supple.  Skin:    General: Skin is warm and dry.  Neurological:     General:  No focal deficit present.     Mental Status: He is alert and oriented to person, place, and time.  Psychiatric:        Mood and Affect: Mood and affect normal.     ED Results / Procedures / Treatments   Labs (all labs ordered are listed, but only abnormal results are displayed) Labs Reviewed  URINE CULTURE  URINALYSIS, ROUTINE W REFLEX MICROSCOPIC  BASIC METABOLIC PANEL  CBC    EKG None  Radiology No results found.  Procedures Procedures   Medications Ordered in ED Medications -  No data to display  ED Course  I have reviewed the triage vital signs and the nursing notes.  Pertinent labs & imaging results that were available during my care of the patient were reviewed by me and considered in my medical decision making (see chart for details).    MDM Rules/Calculators/A&P                          MDM  Screen complete  Raymond Woods was evaluated in Emergency Department on 08/26/2020 for the symptoms described in the history of present illness. He was evaluated in the context of the global COVID-19 pandemic, which necessitated consideration that the patient might be at risk for infection with the SARS-CoV-2 virus that causes COVID-19. Institutional protocols and algorithms that pertain to the evaluation of patients at risk for COVID-19 are in a state of rapid change based on information released by regulatory bodies including the CDC and federal and state organizations. These policies and algorithms were followed during the patient's care in the ED.   Patient is presenting for reported trace amounts of hematuria that occurred over the last week.  Patient does report that his hematuria is most notable after having BM.  I am suspicious of possible early prostatic disease.  Patient without evidence of significant hematuria.  UA today is normal.  No gross blood seen.  Patient with out current fever or evidence of systemic illness.  Patient is appropriate for outpatient  work-up and treatment.  Patient understands need for close follow-up with urology.  Patient also has primary care provider that needs to be contacted regarding his symptoms.  Patient and his wife at bedside understand plan of care.  Importance of close follow-up was stressed.  Strict return precautions given and understood.   Final Clinical Impression(s) / ED Diagnoses Final diagnoses:  Hematuria, unspecified type    Rx / DC Orders ED Discharge Orders    None       Wynetta Fines, MD 08/26/20 1718

## 2020-08-26 NOTE — ED Notes (Signed)
Patient transported to CT 

## 2020-08-27 LAB — URINE CULTURE: Culture: NO GROWTH

## 2020-08-28 ENCOUNTER — Telehealth: Payer: Self-pay

## 2020-08-28 NOTE — Telephone Encounter (Signed)
Able to reach pt regarding his recent lab work, Dr. Okey Dupre had a chance to review his results and advised  "Stable kidney function and electrolytes. Continue current medications and follow-up as previously recommended" Mr. Raymond Woods verbalized understanding, delighted of good report. Nothing further at this time.

## 2020-09-02 ENCOUNTER — Telehealth: Payer: Self-pay | Admitting: Family Medicine

## 2020-09-02 DIAGNOSIS — R319 Hematuria, unspecified: Secondary | ICD-10-CM

## 2020-09-02 NOTE — Telephone Encounter (Signed)
I need info re: reason/symptoms.  Thanks.

## 2020-09-02 NOTE — Telephone Encounter (Signed)
Patient states he needs referral for urology due to urinating blood at the end of urination stream. States this has been happening off and on for about 2 weeks. States he called here and was advised to go to ER and he did. They told him he needed to contact PCP for referral to urology. There is no pain but some slight irritation but then that goes away.

## 2020-09-02 NOTE — Telephone Encounter (Signed)
Pt called in wanted to know about getting a referral for a urologist.

## 2020-09-02 NOTE — Telephone Encounter (Signed)
Referral ordered. Thanks.  I wanted to make sure there wasn't something else/new going on.

## 2020-09-02 NOTE — Telephone Encounter (Signed)
Left message for patient that referral has been placed

## 2020-10-14 ENCOUNTER — Other Ambulatory Visit: Payer: Self-pay | Admitting: Internal Medicine

## 2020-10-20 NOTE — Progress Notes (Signed)
Office Visit    Patient Name: Raymond Woods Date of Encounter: 10/22/2020  PCP:  Joaquim Nam, MD   [facility]  Cardiologist:  Yvonne Kendall, MD  Advanced Practice Provider:  No care team member to display Electrophysiologist:  None   Chief Complaint    Raymond Woods is a 55 y.o. male with a hx of hypertension, hepatic steatosis presents today for follow-up of hypertension  Past Medical History    Past Medical History:  Diagnosis Date  . Fatty liver   . Hypertension    Past Surgical History:  Procedure Laterality Date  . ESOPHAGOGASTRODUODENOSCOPY     bx negative stricture secondary reflux, Barretts neagtive 03/20/02, + H Pylori, ABD U/S negative, fatty liver, left kidney prom soft tissue-CT 01/27/2006, CT  abd/pelvis dromedary hump of kidney 02/03/06  . HERNIA REPAIR     37 weeks old    Allergies  Allergies  Allergen Reactions  . Pine Other (See Comments)    Pollen- Congestion    History of Present Illness    Raymond Woods is a 55 y.o. male with a hx of hypertension, hepatic steatosis last seen 04/23/20 by Dr. Okey Dupre.  He noted fatigue at clinic visit 07/2019 though sleep study was deferred. His Amlodipine was increased to 10mg  daily due tot elevated BP. When seen 04/2020 he noted 5-6 month history of lower extremity edema. His amlodipine was reduced to 5mg  daily and HCTZ 25mg  started.   He presents today for follow up. Reports feeling overall well and started exercising by walking one month ago. Stays busy working in 05/2020. Reports no shortness of breath nor dyspnea on exertion. Reports no chest pain, pressure, or tightness. No edema, orthopnea, PND. Reports no palpitations. Does endorse some fatigue which he attributes to not sleeping well. He snores, wakes tired, and often feel he could use an afternoon nap. He has not had sleep study previously.   EKGs/Labs/Other Studies Reviewed:   The following studies were  reviewed today:   EKG:  EKG is ordered today.  The ekg ordered today demonstrates NSR 70 bpm with no acute ST/T wave changes.  Recent Labs: 04/10/2020: ALT 27 08/26/2020: BUN 7; Creatinine, Ser 0.86; Hemoglobin 15.5; Platelets 208; Potassium 3.7; Sodium 135  Recent Lipid Panel    Component Value Date/Time   CHOL 171 04/10/2020 0806   TRIG 97.0 04/10/2020 0806   HDL 60.70 04/10/2020 0806   CHOLHDL 3 04/10/2020 0806   VLDL 19.4 04/10/2020 0806   LDLCALC 91 04/10/2020 0806    Home Medications   Current Meds  Medication Sig  . amLODipine (NORVASC) 5 MG tablet TAKE 1 TABLET (5 MG TOTAL) BY MOUTH DAILY.  . calcium carbonate-magnesium hydroxide (ROLAIDS) 334 MG CHEW Chew 1 tablet by mouth as needed (for heartburn).  . fish oil-omega-3 fatty acids 1000 MG capsule Take 2 g by mouth 3 (three) times a week.  . hydrochlorothiazide (HYDRODIURIL) 25 MG tablet TAKE 1 TABLET (25 MG TOTAL) BY MOUTH DAILY.  . multivitamin (ONE-A-DAY MEN'S) TABS tablet Take 1 tablet by mouth 3 (three) times a week.     Review of Systems  All other systems reviewed and are otherwise negative except as noted above.  Physical Exam    VS:  BP 118/80   Pulse 70   Ht 5\' 9"  (1.753 m)   Wt 251 lb (113.9 kg)   BMI 37.07 kg/m  , BMI Body mass index is 37.07 kg/m.  Wt Readings from  Last 3 Encounters:  10/22/20 251 lb (113.9 kg)  08/26/20 245 lb (111.1 kg)  04/23/20 249 lb (112.9 kg)    GEN: Well nourished, well developed, in no acute distress. HEENT: normal. Neck: Supple, no JVD, carotid bruits, or masses. Cardiac: RRR, no murmurs, rubs, or gallops. No clubbing, cyanosis, edema.  Radials/PT 2+ and equal bilaterally.  Respiratory:  Respirations regular and unlabored, clear to auscultation bilaterally. GI: Soft, nontender, nondistended. MS: No deformity or atrophy. Skin: Warm and dry, no rash. Neuro:  Strength and sensation are intact. Psych: Normal affect.  Assessment & Plan    1. Hypertension- BP well  controlled. Continue current antihypertensive regimen including Amlodipine 5mg  QD, HCTZ 25mg  daily, Toprol 100mg  daily. BMP 08/2020 with normal renal function. No recurrent lower extremity edema since reduced dose of amlodipine. Heart healthy diet and 150 minutes of moderate intensity exercise per week encouraged. 2. Sleep disordered breathing / Snores - No previous sleep study. Encouraged sleep study which he politely declined. Notes snoring and nonrestorative sleep. Discussed the risk of untreated sleep apnea on cardia health.  3. Obesity - Weight loss via diet and exercise encouraged. Discussed the impact being overweight would have on cardiovascular risk as well as his blood pressure.   Disposition: Follow up in 6 month(s) with Dr. or APP  Signed, , NP 10/22/2020, 11:18 AM [facility]

## 2020-10-22 ENCOUNTER — Other Ambulatory Visit: Payer: Self-pay

## 2020-10-22 ENCOUNTER — Ambulatory Visit: Payer: BC Managed Care – PPO | Admitting: Family

## 2020-10-22 ENCOUNTER — Encounter: Payer: Self-pay | Admitting: Family

## 2020-10-22 VITALS — BP 118/80 | HR 70 | Ht 69.0 in | Wt 251.0 lb

## 2020-10-22 DIAGNOSIS — Z6837 Body mass index (BMI) 37.0-37.9, adult: Secondary | ICD-10-CM | POA: Diagnosis not present

## 2020-10-22 DIAGNOSIS — I10 Essential (primary) hypertension: Secondary | ICD-10-CM

## 2020-10-22 DIAGNOSIS — G4730 Sleep apnea, unspecified: Secondary | ICD-10-CM

## 2020-10-22 DIAGNOSIS — R0683 Snoring: Secondary | ICD-10-CM | POA: Diagnosis not present

## 2020-10-22 NOTE — Patient Instructions (Signed)
Medication Instructions:  Continue your current medications   *If you need a refill on your cardiac medications before your next appointment, please call your pharmacy*   Lab Work: None ordered today  Testing/Procedures: EKG today shows normal sinus rhythm.   Follow-Up: At [facility], you and your health needs are our priority.  As part of our continuing mission to provide you with exceptional heart care, we have created designated Provider Care Teams.  These Care Teams include your primary Cardiologist (physician) and Advanced Practice Providers (APPs -  Physician Assistants and Nurse Practitioners) who all work together to provide you with the care you need, when you need it.  We recommend signing up for the patient portal called "MyChart".  Sign up information is provided on this After Visit Summary.  MyChart is used to connect with patients for Virtual Visits (Telemedicine).  Patients are able to view lab/test results, encounter notes, upcoming appointments, etc.  Non-urgent messages can be sent to your provider as well.   To learn more about what you can do with MyChart, go to [email or web address].    Your next appointment:   6 month(s)  The format for your next appointment:   In Person  Provider:   You may see Yvonne Kendall, MD or one of the following Advanced Practice Providers on your designated Care Team:    Nicolasa Ducking, NP  Eula Listen, PA-C  Marisue Ivan, PA-C  Cadence [place], [place]  Gillian Shields, NP  Other Instructions  If you decide you want to do a sleep study, please call and let us know.   Heart Healthy Diet Recommendations: A low-salt diet is recommended. Meats should be grilled, baked, or boiled. Avoid fried foods. Focus on lean protein sources like fish or chicken with vegetables and fruits. The American Heart Association is a Chief Technology Officer!  American Heart Association Diet and Lifeystyle Recommendations   Exercise recommendations: The  American Heart Association recommends 150 minutes of moderate intensity exercise weekly. Try 30 minutes of moderate intensity exercise 4-5 times per week. This could include walking, jogging, or swimming.

## 2021-01-10 ENCOUNTER — Other Ambulatory Visit: Payer: Self-pay | Admitting: Internal Medicine

## 2021-04-09 ENCOUNTER — Other Ambulatory Visit: Payer: Self-pay | Admitting: Internal Medicine

## 2021-04-09 NOTE — Telephone Encounter (Signed)
LMOV  

## 2021-04-09 NOTE — Telephone Encounter (Signed)
Please schedule 6 month F/U appointment. Thank you! 

## 2021-04-12 ENCOUNTER — Other Ambulatory Visit: Payer: Self-pay | Admitting: Family Medicine

## 2021-04-12 DIAGNOSIS — I10 Essential (primary) hypertension: Secondary | ICD-10-CM

## 2021-04-14 ENCOUNTER — Other Ambulatory Visit: Payer: BC Managed Care – PPO

## 2021-04-20 ENCOUNTER — Other Ambulatory Visit: Payer: Self-pay

## 2021-04-20 ENCOUNTER — Encounter: Payer: Self-pay | Admitting: Family Medicine

## 2021-04-20 ENCOUNTER — Ambulatory Visit (INDEPENDENT_AMBULATORY_CARE_PROVIDER_SITE_OTHER): Payer: BC Managed Care – PPO | Admitting: Family Medicine

## 2021-04-20 VITALS — BP 120/82 | HR 70 | Temp 97.6°F | Ht 69.0 in | Wt 253.0 lb

## 2021-04-20 DIAGNOSIS — I10 Essential (primary) hypertension: Secondary | ICD-10-CM

## 2021-04-20 DIAGNOSIS — R319 Hematuria, unspecified: Secondary | ICD-10-CM

## 2021-04-20 DIAGNOSIS — Z87448 Personal history of other diseases of urinary system: Secondary | ICD-10-CM | POA: Diagnosis not present

## 2021-04-20 DIAGNOSIS — Z1211 Encounter for screening for malignant neoplasm of colon: Secondary | ICD-10-CM

## 2021-04-20 DIAGNOSIS — Z0000 Encounter for general adult medical examination without abnormal findings: Secondary | ICD-10-CM

## 2021-04-20 DIAGNOSIS — Z7189 Other specified counseling: Secondary | ICD-10-CM

## 2021-04-20 LAB — COMPREHENSIVE METABOLIC PANEL
ALT: 26 U/L (ref 0–53)
AST: 25 U/L (ref 0–37)
Albumin: 4.5 g/dL (ref 3.5–5.2)
Alkaline Phosphatase: 66 U/L (ref 39–117)
BUN: 10 mg/dL (ref 6–23)
CO2: 27 mEq/L (ref 19–32)
Calcium: 9.6 mg/dL (ref 8.4–10.5)
Chloride: 98 mEq/L (ref 96–112)
Creatinine, Ser: 0.81 mg/dL (ref 0.40–1.50)
GFR: 100.58 mL/min (ref >60.00)
Glucose, Bld: 101 mg/dL — ABNORMAL HIGH (ref 70–99)
Potassium: 3.8 mEq/L (ref 3.5–5.1)
Sodium: 138 mEq/L (ref 135–145)
Total Bilirubin: 0.8 mg/dL (ref 0.2–1.2)
Total Protein: 6.9 g/dL (ref 6.0–8.3)

## 2021-04-20 LAB — LIPID PANEL
Cholesterol: 203 mg/dL — ABNORMAL HIGH (ref 0–200)
HDL: 76.1 mg/dL (ref >39.00)
LDL Cholesterol: 112 mg/dL — ABNORMAL HIGH (ref 0–99)
NonHDL: 126.91
Total CHOL/HDL Ratio: 3
Triglycerides: 77 mg/dL (ref 0.0–149.0)
VLDL: 15.4 mg/dL (ref 0.0–40.0)

## 2021-04-20 MED ORDER — METOPROLOL SUCCINATE ER 100 MG PO TB24: 100.0000 mg | ORAL_TABLET | Freq: Every day | ORAL | 3 refills | Status: DC

## 2021-04-20 NOTE — Patient Instructions (Signed)
Go to the lab on the way out.   If you have mychart we'll likely use that to update you.    Take care.  Glad to see you. If your have any more trouble with blood in your urine then let me know and we can set you up with urology.

## 2021-04-20 NOTE — Progress Notes (Signed)
This visit occurred during the SARS-CoV-2 public health emergency.  Safety protocols were in place, including screening questions prior to the visit, additional usage of staff PPE, and extensive cleaning of exam room while observing appropriate contact time as indicated for disinfecting solutions.  CPE- See plan.  Routine anticipatory guidance given to patient.  See health maintenance.  The possibility exists that previously documented standard health maintenance information may have been brought forward from a previous encounter into this note.  If needed, that same information has been updated to reflect the current situation based on today's encounter.    Tetanus 2017. PNA and shingles not due yet, d/w pt.  Flu shot encouraged. D/w pt.  covid vaccine prev done.   Prostate cancer screening and PSA options (with potential risks and benefits of testing vs not testing) were discussed along with recent recs/guidelines.  He declined testing PSA at this point. D/w patient [id number] for colon cancer screening, including IFOB vs. colonoscopy.  Risks and benefits of both were discussed and patient voiced understanding.  Pt elects for: cologuard.   Living will d/w pt. Wife designated if patient were incapacitated.  Diet and exercise d/w pt.   HIV testing d/w pt. Per patient, was neg on testing with outside labs ~2014. D/w pt about dip tobacco, encouraged cessation.  He is considering options.   Hypertension:    Using medication without problems or lightheadedness: yes Chest pain with exertion:no Edema:no Short of breath: related to job stressors, d/w pt.  Not SOB walking up a hill.  He is going to f/u with cardiology, d/w pt.    Prev hematuria resolved.  He didn't have urology f/u in the meantime.  Prev CT unremarkable.  No blood seen in urine in the meantime.  D/w pt about possible urology f/u.   See avs.   PMH and SH reviewed  Meds, vitals, and allergies reviewed.   ROS: Per HPI.  Unless  specifically indicated otherwise in HPI, the patient denies:  General: fever. Eyes: acute vision changes ENT: sore throat Cardiovascular: chest pain Respiratory: SOB GI: vomiting GU: dysuria Musculoskeletal: acute back pain Derm: acute rash Neuro: acute motor dysfunction Psych: worsening mood Endocrine: polydipsia Heme: bleeding Allergy: hayfever  GEN: nad, alert and oriented HEENT: ncat NECK: supple w/o LA CV: rrr. PULM: ctab, no inc wob ABD: soft, +bs EXT: no edema SKIN: no acute rash

## 2021-04-21 LAB — URINALYSIS, ROUTINE W REFLEX MICROSCOPIC
Bilirubin Urine: NEGATIVE
Hgb urine dipstick: NEGATIVE
Ketones, ur: NEGATIVE
Leukocytes,Ua: NEGATIVE
Nitrite: NEGATIVE
RBC / HPF: NONE SEEN (ref 0–?)
Specific Gravity, Urine: <=1.005 — AB (ref 1.000–1.030)
Total Protein, Urine: NEGATIVE
Urine Glucose: NEGATIVE
Urobilinogen, UA: 0.2 (ref 0.0–1.0)
WBC, UA: NONE SEEN (ref 0–?)
pH: 6.5 (ref 5.0–8.0)

## 2021-04-23 DIAGNOSIS — R319 Hematuria, unspecified: Secondary | ICD-10-CM | POA: Insufficient documentation

## 2021-04-23 NOTE — Assessment & Plan Note (Signed)
Living will d/w pt.  Wife designated if patient were incapacitated.   ?

## 2021-04-23 NOTE — Assessment & Plan Note (Signed)
Prev hematuria resolved.  He didn't have urology f/u in the meantime.  Prev CT unremarkable.  No blood seen in urine in the meantime.  D/w pt about possible urology f/u.   See avs.

## 2021-04-23 NOTE — Assessment & Plan Note (Signed)
Continue metoprolol.  Continue ARB or thiazide.  Continue work on diet and exercise.  See notes on labs.

## 2021-04-23 NOTE — Assessment & Plan Note (Signed)
Tetanus 2017. PNA and shingles not due yet, d/w pt.  Flu shot encouraged. D/w pt.  covid vaccine prev done.   Prostate cancer screening and PSA options (with potential risks and benefits of testing vs not testing) were discussed along with recent recs/guidelines.  He declined testing PSA at this point. D/w patient [id number] for colon cancer screening, including IFOB vs. colonoscopy.  Risks and benefits of both were discussed and patient voiced understanding.  Pt elects for: cologuard.   Living will d/w pt. Wife designated if patient were incapacitated.  Diet and exercise d/w pt.   HIV testing d/w pt. Per patient, was neg on testing with outside labs ~2014. D/w pt about dip tobacco, encouraged cessation.  He is considering options.

## 2021-06-03 NOTE — Progress Notes (Signed)
Follow-up Outpatient Visit Date: 06/04/2021  Primary Care Provider: Joaquim Nam, MD [address] [place] [address]  Chief Complaint: Follow-up hypertension  HPI:  Raymond Woods is a 55 y.o. male with history of hypertension and hepatic steatosis, who presents for follow-up of hypertension.  He was last seen in our office in April by Gillian Shields, NP, at which time he reported feeling well.  He had started walking on a regular basis.  Blood pressure was reasonably well controlled.  No medication changes or further testing were recommended.  Today, Raymond Woods reports that he feels well, denying chest pain, shortness of breath, palpitations, lightheadedness, and edema.  He notes that if he misses a dose of his blood pressure medications, that he feels somewhat anxious.  He walks around some at work and at home but does not exercise regularly.  --------------------------------------------------------------------------------------------------  Cardiovascular History & Procedures: Cardiovascular Problems: Family history of coronary artery disease Right bundle branch block   Risk Factors: Hypertension, male gender, and obesity   Cath/PCI: None   CV Surgery: None   EP Procedures and Devices: None   Non-Invasive Evaluation(s): None  Recent CV Pertinent Labs: Lab Results  Component Value Date   CHOL 203 (H) 04/20/2021   HDL 76.10 04/20/2021   LDLCALC 112 (H) 04/20/2021   TRIG 77.0 04/20/2021   CHOLHDL 3 04/20/2021   K 3.8 04/20/2021   BUN 10 04/20/2021   CREATININE 0.81 04/20/2021    Past medical and surgical history were reviewed and updated in EPIC.  Current Meds  Medication Sig   amLODipine (NORVASC) 5 MG tablet Take 1 tablet (5 mg total) by mouth daily.   calcium carbonate-magnesium hydroxide (ROLAIDS) 334 MG CHEW Chew 1 tablet by mouth as needed (for heartburn).   fish oil-omega-3 fatty acids 1000 MG capsule Take 2 g by mouth 3 (three)  times a week.   hydrochlorothiazide (HYDRODIURIL) 25 MG tablet Take 1 tablet (25 mg total) by mouth daily.   metoprolol succinate (TOPROL-XL) 100 MG 24 hr tablet Take 1 tablet (100 mg total) by mouth daily. Take with or immediately following a meal.   multivitamin (ONE-A-DAY MEN'S) TABS tablet Take 1 tablet by mouth 3 (three) times a week.    Allergies: Pine  Social History   Tobacco Use   Smoking status: Never   Smokeless tobacco: Current    Types: Chew  Vaping Use   Vaping Use: Never used  Substance Use Topics   Alcohol use: Yes    Comment: 2 drinks per night   Drug use: No    Family History  Problem Relation Age of Onset   Heart attack Father 62   Coronary artery disease Father    Hypertension Father    COPD Father    Asthma Father    Heart disease Father    Diverticulitis Brother        27   Colitis Brother    Hypertension Mother    Coronary artery disease Paternal Grandfather    Diabetes Other        cousins   Stroke Paternal Grandmother    Stroke Maternal Grandmother    Prostate cancer Neg Hx    Colon cancer Neg Hx     Review of Systems: A 12-system review of systems was performed and was negative except as noted in the HPI.  --------------------------------------------------------------------------------------------------  Physical Exam: BP (!) 130/94 (BP Location: Left Arm, Patient Position: Sitting, Cuff Size: Large)   Pulse 70  Ht 5' 8.5" (1.74 m)   Wt 249 lb (112.9 kg)   SpO2 98%   BMI 37.31 kg/m  Repeat BP: 122/78  General:  NAD. Neck: No JVD or HJR. Lungs: Clear to auscultation bilaterally without wheezes or crackles. Heart: Regular rate and rhythm without murmurs, rubs, or gallops. Abdomen: Soft, nontender, nondistended. Extremities: No lower extremity edema.  EKG: Normal sinus rhythm with right bundle branch block, new since 10/22/2020.  Lab Results  Component Value Date   WBC 6.4 08/26/2020   HGB 15.5 08/26/2020   HCT 46.1  08/26/2020   MCV 95.8 08/26/2020   PLT 208 08/26/2020    Lab Results  Component Value Date   NA 138 04/20/2021   K 3.8 04/20/2021   CL 98 04/20/2021   CO2 27 04/20/2021   BUN 10 04/20/2021   CREATININE 0.81 04/20/2021   GLUCOSE 101 (H) 04/20/2021   ALT 26 04/20/2021    Lab Results  Component Value Date   CHOL 203 (H) 04/20/2021   HDL 76.10 04/20/2021   LDLCALC 112 (H) 04/20/2021   TRIG 77.0 04/20/2021   CHOLHDL 3 04/20/2021    --------------------------------------------------------------------------------------------------  ASSESSMENT AND PLAN: Hypertension: Blood pressure initially mildly elevated but normal on repeat.  Continue current regimen of metoprolol, HCTZ, and amlodipine.  Continuing lifestyle modifications recommended.  Right bundle branch block: New finding on EKG today.  No symptoms of high-grade AV block or heart failure reported.  Given risk factors for structural heart disease including hypertension and obesity, we have agreed to obtain an echocardiogram.  Based on results of echocardiogram, ischemia evaluation will also need to be considered.  Follow-up: Return to clinic in 3 months.  [name], MD 06/04/2021 8:26 AM

## 2021-06-04 ENCOUNTER — Other Ambulatory Visit: Payer: Self-pay

## 2021-06-04 ENCOUNTER — Encounter: Payer: Self-pay | Admitting: Internal Medicine

## 2021-06-04 ENCOUNTER — Ambulatory Visit: Payer: BC Managed Care – PPO | Admitting: Internal Medicine

## 2021-06-04 VITALS — BP 130/94 | HR 70 | Ht 68.5 in | Wt 249.0 lb

## 2021-06-04 DIAGNOSIS — I4510 Unspecified right bundle-branch block: Secondary | ICD-10-CM | POA: Diagnosis not present

## 2021-06-04 DIAGNOSIS — I10 Essential (primary) hypertension: Secondary | ICD-10-CM | POA: Diagnosis not present

## 2021-06-04 NOTE — Patient Instructions (Signed)
Medication Instructions:   Your physician recommends that you continue on your current medications as directed. Please refer to the Current Medication list given to you today.  *If you need a refill on your cardiac medications before your next appointment, please call your pharmacy*   Lab Work:  None ordered  Testing/Procedures:  Your physician has requested that you have an echocardiogram. Echocardiography is a painless test that uses sound waves to create images of your heart. It provides your doctor with information about the size and shape of your heart and how well your heart's chambers and valves are working. This procedure takes approximately one hour. There are no restrictions for this procedure.   Follow-Up: At [facility], you and your health needs are our priority.  As part of our continuing mission to provide you with exceptional heart care, we have created designated Provider Care Teams.  These Care Teams include your primary Cardiologist (physician) and Advanced Practice Providers (APPs -  Physician Assistants and Nurse Practitioners) who all work together to provide you with the care you need, when you need it.  We recommend signing up for the patient portal called "MyChart".  Sign up information is provided on this After Visit Summary.  MyChart is used to connect with patients for Virtual Visits (Telemedicine).  Patients are able to view lab/test results, encounter notes, upcoming appointments, etc.  Non-urgent messages can be sent to your provider as well.   To learn more about what you can do with MyChart, go to [email or web address].    Your next appointment:   3 month(s)  The format for your next appointment:   In Person  Provider:   You may see Yvonne Kendall, MD or one of the following Advanced Practice Providers on your designated Care Team:   Nicolasa Ducking, NP Eula Listen, PA-C Cadence Fransico Michael, [place]

## 2021-07-09 ENCOUNTER — Ambulatory Visit (INDEPENDENT_AMBULATORY_CARE_PROVIDER_SITE_OTHER): Payer: BC Managed Care – PPO

## 2021-07-09 ENCOUNTER — Other Ambulatory Visit: Payer: Self-pay

## 2021-07-09 DIAGNOSIS — I10 Essential (primary) hypertension: Secondary | ICD-10-CM | POA: Diagnosis not present

## 2021-07-09 DIAGNOSIS — I4510 Unspecified right bundle-branch block: Secondary | ICD-10-CM | POA: Diagnosis not present

## 2021-07-09 LAB — ECHOCARDIOGRAM COMPLETE
AR max vel: 2.62 cm2
AV Area VTI: 2.91 cm2
AV Area mean vel: 2.42 cm2
AV Mean grad: 4 mmHg
AV Peak grad: 6.5 mmHg
Ao pk vel: 1.27 m/s
Area-P 1/2: 3.7 cm2
Calc EF: 69.4 %
S' Lateral: 2.5 cm
Single Plane A2C EF: 68.9 %
Single Plane A4C EF: 69.5 %

## 2021-09-04 ENCOUNTER — Ambulatory Visit: Payer: BC Managed Care – PPO | Admitting: Internal Medicine

## 2021-11-04 ENCOUNTER — Encounter: Payer: Self-pay | Admitting: Internal Medicine

## 2021-11-04 ENCOUNTER — Ambulatory Visit: Payer: BC Managed Care – PPO | Admitting: Internal Medicine

## 2021-11-04 VITALS — BP 124/82 | HR 71 | Ht 68.0 in | Wt 252.0 lb

## 2021-11-04 DIAGNOSIS — I10 Essential (primary) hypertension: Secondary | ICD-10-CM | POA: Diagnosis not present

## 2021-11-04 DIAGNOSIS — I4510 Unspecified right bundle-branch block: Secondary | ICD-10-CM | POA: Diagnosis not present

## 2021-11-04 NOTE — Patient Instructions (Signed)
Medication Instructions:  ? ?Your physician recommends that you continue on your current medications as directed. Please refer to the Current Medication list given to you today. ? ?*If you need a refill on your cardiac medications before your next appointment, please call your pharmacy* ? ? ?Lab Work: ? ?None ordered ? ?Testing/Procedures: ? ?None ordered ? ? ?Follow-Up: ?At [facility], you and your health needs are our priority.  As part of our continuing mission to provide you with exceptional heart care, we have created designated Provider Care Teams.  These Care Teams include your primary Cardiologist (physician) and Advanced Practice Providers (APPs -  Physician Assistants and Nurse Practitioners) who all work together to provide you with the care you need, when you need it. ? ?We recommend signing up for the patient portal called "MyChart".  Sign up information is provided on this After Visit Summary.  MyChart is used to connect with patients for Virtual Visits (Telemedicine).  Patients are able to view lab/test results, encounter notes, upcoming appointments, etc.  Non-urgent messages can be sent to your provider as well.   ?To learn more about what you can do with MyChart, go to [email or web address].   ? ?Your next appointment:   ?1 year(s) ? ?The format for your next appointment:   ?In Person ? ?Provider:   ?You may see Yvonne Kendall, MD or one of the following Advanced Practice Providers on your designated Care Team:   ?Nicolasa Ducking, NP ?Eula Listen, PA-C ?Cadence Fransico Michael, PA-C}  ? ? ?Other Instructions ? ? ?1) Sleep Apnea ? ?Sleep apnea is a condition in which breathing pauses or becomes shallow during sleep. People with sleep apnea usually snore loudly. They may have times when they gasp and stop breathing for 10 seconds or more during sleep. This may happen many times during the night. ?Sleep apnea disrupts your sleep and keeps your body from getting the rest that it needs. This condition can  increase your risk of certain health problems, including: ?Heart attack. ?Stroke. ?Obesity. ?Type 2 diabetes. ?Heart failure. ?Irregular heartbeat. ?High blood pressure. ?The goal of treatment is to help you breathe normally again. ?What are the causes? ? ?The most common cause of sleep apnea is a collapsed or blocked airway. ?There are three kinds of sleep apnea: ?Obstructive sleep apnea. This kind is caused by a blocked or collapsed airway. ?Central sleep apnea. This kind happens when the part of the brain that controls breathing does not send the correct signals to the muscles that control breathing. ?Mixed sleep apnea. This is a combination of obstructive and central sleep apnea. ?What increases the risk? ?You are more likely to develop this condition if you: ?Are overweight. ?Smoke. ?Have a smaller than normal airway. ?Are older. ?Are male. ?Drink alcohol. ?Take sedatives or tranquilizers. ?Have a family history of sleep apnea. ?Have a tongue or tonsils that are larger than normal. ?What are the signs or symptoms? ?Symptoms of this condition include: ?Trouble staying asleep. ?Loud snoring. ?Morning headaches. ?Waking up gasping. ?Dry mouth or sore throat in the morning. ?Daytime sleepiness and tiredness. ?If you have daytime fatigue because of sleep apnea, you may be more likely to have: ?Trouble concentrating. ?Forgetfulness. ?Irritability or mood swings. ?Personality changes. ?Feelings of depression. ?Sexual dysfunction. This may include loss of interest if you are male, or erectile dysfunction if you are male. ?How is this diagnosed? ?This condition may be diagnosed with: ?A medical history. ?A physical exam. ?A series of tests that are done while you  are sleeping (sleep study). These tests are usually done in a sleep lab, but they may also be done at home. ?How is this treated? ?Treatment for this condition aims to restore normal breathing and to ease symptoms during sleep. It may involve managing health  issues that can affect breathing, such as high blood pressure or obesity. Treatment may include: ?Sleeping on your side. ?Using a decongestant if you have nasal congestion. ?Avoiding the use of depressants, including alcohol, sedatives, and narcotics. ?Losing weight if you are overweight. ?Making changes to your diet. ?Quitting smoking. ?Using a device to open your airway while you sleep, such as: ?An oral appliance. This is a custom-made mouthpiece that shifts your lower jaw forward. ?A continuous positive airway pressure (CPAP) device. This device blows air through a mask when you breathe out (exhale). ?A nasal expiratory positive airway pressure (EPAP) device. This device has valves that you put into each nostril. ?A bi-level positive airway pressure (BIPAP) device. This device blows air through a mask when you breathe in (inhale) and breathe out (exhale). ?Having surgery if other treatments do not work. During surgery, excess tissue is removed to create a wider airway. ?Follow these instructions at home: ?Lifestyle ?Make any lifestyle changes that your health care provider recommends. ?Eat a healthy, well-balanced diet. ?Take steps to lose weight if you are overweight. ?Avoid using depressants, including alcohol, sedatives, and narcotics. ?Do not use any products that contain nicotine or tobacco. These products include cigarettes, chewing tobacco, and vaping devices, such as e-cigarettes. If you need help quitting, ask your health care provider. ?General instructions ?Take over-the-counter and prescription medicines only as told by your health care provider. ?If you were given a device to open your airway while you sleep, use it only as told by your health care provider. ?If you are having surgery, make sure to tell your health care provider you have sleep apnea. You may need to bring your device with you. ?Keep all follow-up visits. This is important. ?Contact a health care provider if: ?The device that you  received to open your airway during sleep is uncomfortable or does not seem to be working. ?Your symptoms do not improve. ?Your symptoms get worse. ?Get help right away if: ?You develop: ?Chest pain. ?Shortness of breath. ?Discomfort in your back, arms, or stomach. ?You have: ?Trouble speaking. ?Weakness on one side of your body. ?Drooping in your face. ?These symptoms may represent a serious problem that is an emergency. Do not wait to see if the symptoms will go away. Get medical help right away. Call your local emergency services (911 in the [place] Do not drive yourself to the hospital. ?Summary ?Sleep apnea is a condition in which breathing pauses or becomes shallow during sleep. ?The most common cause is a collapsed or blocked airway. ?The goal of treatment is to restore normal breathing and to ease symptoms during sleep. ?This information is not intended to replace advice given to you by your health care provider. Make sure you discuss any questions you have with your health care provider. ?Document Revised: 02/11/2021 Document Reviewed: 06/13/2020 ?Elsevier Patient Education ? 2023 Elsevier Inc. ? ? ?2) Please see handout for WatchPat Home Sleep Study given today ? ? ?Important Information About Sugar ? ? ? ? ? ? ?

## 2021-11-04 NOTE — Progress Notes (Signed)
? ?Follow-up Outpatient Visit ?Date: 11/04/2021 ? ?Primary Care Provider: ?Tonia Ghent, MD ?[address] ?[place] [address] ? ?Chief Complaint: Follow-up hypertension and RBBB ? ?HPI:  Raymond Woods is a 56 y.o. male with history of hypertension and hepatic steatosis, who presents for follow-up of hypertension.  I last saw him in 05/2019, at which time he was feeling well.  Blood pressure was mildly elevated.  New diagnosis of right bundle branch block was made.  We agreed to obtain an echocardiogram, which showed mild to moderate LVH and grade 1 diastolic dysfunction with normal LVEF.  No significant valvular abnormality was identified. ? ?Today, [name] reports that he continues to feel well other than some intermittent fatigue.  He wonders if this is due to his weight.  He has not been exercising regularly.  He intermittently checks his blood pressure and notes that it has been "decent."  He is tolerating his medications well.  He denies chest pain, shortness of breath, palpitations, lightheadedness, and edema.  On further questioning, he has trouble sleeping many nights and often does not feel refreshed in the morning.  He naps when given the opportunity, usually on "Sundays. ? ?-------------------------------------------------------------------------------------------------- ? ?Cardiovascular History & Procedures: ?Cardiovascular Problems: ?Family history of coronary artery disease ?Right bundle branch block ?  ?Risk Factors: ?Hypertension, male gender, and obesity ?  ?Cath/PCI: ?None ?  ?CV Surgery: ?None ?  ?EP Procedures and Devices: ?None ?  ?Non-Invasive Evaluation(s): ?TTE (07/09/2021): Normal LV size with mild-moderate LVH.  LVEF 60-65% with grade 1 diastolic dysfunction.  Normal RV size and function.  Normal biatrial size.  No significant valvular abnormality. ? ?Recent CV Pertinent Labs: ?Lab Results  ?Component Value Date  ? CHOL 203 (H) 04/20/2021  ? HDL 76.10 04/20/2021  ?  LDLCALC 112 (H) 04/20/2021  ? TRIG 77.0 04/20/2021  ? CHOLHDL 3 04/20/2021  ? K 3.8 04/20/2021  ? BUN 10 04/20/2021  ? CREATININE 0.81 04/20/2021  ? ? ?Past medical and surgical history were reviewed and updated in EPIC. ? ?Current Meds  ?Medication Sig  ? amLODipine (NORVASC) 5 MG tablet Take 1 tablet (5 mg total) by mouth daily.  ? calcium carbonate-magnesium hydroxide (ROLAIDS) 334 MG CHEW Chew 1 tablet by mouth as needed (for heartburn).  ? fish oil-omega-3 fatty acids 1000 MG capsule Take 2 g by mouth 3 (three) times a week.  ? hydrochlorothiazide (HYDRODIURIL) 25 MG tablet Take 1 tablet (25 mg total) by mouth daily.  ? metoprolol succinate (TOPROL-XL) 100 MG 24 hr tablet Take 1 tablet (100 mg total) by mouth daily. Take with or immediately following a meal.  ? multivitamin (ONE-A-DAY MEN'S) TABS tablet Take 1 tablet by mouth 3 (three) times a week.  ? ? ?Allergies: Pine ? ?Social History  ? ?Tobacco Use  ? Smoking status: Never  ? Smokeless tobacco: Current  ?  Types: Chew  ?Vaping Use  ? Vaping Use: Never used  ?Substance Use Topics  ? Alcohol use: Yes  ?  Comment: 2 drinks per night  ? Drug use: No  ? ? ?Family History  ?Problem Relation Age of Onset  ? Heart attack Father 57  ? Coronary artery disease Father   ? Hypertension Father   ? COPD Father   ? Asthma Father   ? Heart disease Father   ? Diverticulitis Brother   ?     39"   ? Colitis Brother   ? Hypertension Mother   ? Coronary artery disease  Paternal Grandfather   ? Diabetes Other   ?     cousins  ? Stroke Paternal Grandmother   ? Stroke Maternal Grandmother   ? Prostate cancer Neg Hx   ? Colon cancer Neg Hx   ? ? ?Review of Systems: ?A 12-system review of systems was performed and was negative except as noted in the HPI. ? ?-------------------------------------------------------------------------------------------------- ? ?Physical Exam: ?BP 124/82 (BP Location: Left Arm, Patient Position: Sitting, Cuff Size: Large)   Pulse 71   Ht 5\' 8"  (1.727 m)    Wt 252 lb (114.3 kg)   SpO2 98%   BMI 38.32 kg/m?  ? ?General:  NAD. ?Neck: No JVD or HJR. ?Lungs: Clear to auscultation bilaterally without wheezes or crackles. ?Heart: Regular rate and rhythm without murmurs, rubs, or gallops. ?Abdomen: Soft, nontender, nondistended. ?Extremities: No lower extremity edema. ? ?EKG: Normal sinus rhythm with right bundle branch block.  No significant change since 06/04/2021. ? ?Lab Results  ?Component Value Date  ? WBC 6.4 08/26/2020  ? HGB 15.5 08/26/2020  ? HCT 46.1 08/26/2020  ? MCV 95.8 08/26/2020  ? PLT 208 08/26/2020  ? ? ?Lab Results  ?Component Value Date  ? NA 138 04/20/2021  ? K 3.8 04/20/2021  ? CL 98 04/20/2021  ? CO2 27 04/20/2021  ? BUN 10 04/20/2021  ? CREATININE 0.81 04/20/2021  ? GLUCOSE 101 (H) 04/20/2021  ? ALT 26 04/20/2021  ? ? ?Lab Results  ?Component Value Date  ? CHOL 203 (H) 04/20/2021  ? HDL 76.10 04/20/2021  ? LDLCALC 112 (H) 04/20/2021  ? TRIG 77.0 04/20/2021  ? CHOLHDL 3 04/20/2021  ? ? ?-------------------------------------------------------------------------------------------------- ? ?ASSESSMENT AND PLAN: ?Hypertension: ?Blood pressure is reasonably well controlled today.  We will continue current medications.  Given his fatigue, poor sleep, and report of snoring, we discussed proceeding with home sleep study (STOP-BANG score = 7).  However, Mr. Guiang wishes to defer this for now.  I encouraged him to work on weight loss through diet and exercise.  We will continue current regimen of amlodipine, HCTZ, and metoprolol. ? ?Right bundle branch block: ?Stable findings on EKG today.  Echo since our last visit did not show any significant structural abnormalities.  No further intervention at this time. ? ?Follow-up: Return to clinic in 1 year. ? ?[name], MD ?11/04/2021 ?9:43 AM ? ?

## 2022-01-09 ENCOUNTER — Other Ambulatory Visit: Payer: Self-pay | Admitting: Internal Medicine

## 2022-01-20 ENCOUNTER — Encounter: Payer: Self-pay | Admitting: Medical Oncology

## 2022-01-20 ENCOUNTER — Emergency Department: Payer: BC Managed Care – PPO

## 2022-01-20 ENCOUNTER — Emergency Department
Admission: EM | Admit: 2022-01-20 | Discharge: 2022-01-20 | Disposition: A | Discharge status: Home / Self Care | Payer: BC Managed Care – PPO | Attending: Student in an Organized Health Care Education/Training Program | Admitting: Student in an Organized Health Care Education/Training Program

## 2022-01-20 DIAGNOSIS — W228XXA Striking against or struck by other objects, initial encounter: Secondary | ICD-10-CM | POA: Insufficient documentation

## 2022-01-20 DIAGNOSIS — I10 Essential (primary) hypertension: Secondary | ICD-10-CM | POA: Diagnosis not present

## 2022-01-20 DIAGNOSIS — W19XXXA Unspecified fall, initial encounter: Secondary | ICD-10-CM

## 2022-01-20 DIAGNOSIS — S299XXA Unspecified injury of thorax, initial encounter: Secondary | ICD-10-CM | POA: Diagnosis not present

## 2022-01-20 DIAGNOSIS — T1490XA Injury, unspecified, initial encounter: Secondary | ICD-10-CM | POA: Diagnosis not present

## 2022-01-20 DIAGNOSIS — M546 Pain in thoracic spine: Secondary | ICD-10-CM | POA: Diagnosis not present

## 2022-01-20 DIAGNOSIS — M545 Low back pain, unspecified: Secondary | ICD-10-CM | POA: Diagnosis not present

## 2022-01-20 LAB — COMPREHENSIVE METABOLIC PANEL
ALT: 25 U/L (ref 0–44)
AST: 27 U/L (ref 15–41)
Albumin: 4.3 g/dL (ref 3.5–5.0)
Alkaline Phosphatase: 66 U/L (ref 38–126)
Anion gap: 12 (ref 5–15)
BUN: 11 mg/dL (ref 6–20)
CO2: 24 mmol/L (ref 22–32)
Calcium: 9 mg/dL (ref 8.9–10.3)
Chloride: 96 mmol/L — ABNORMAL LOW (ref 98–111)
Creatinine, Ser: 0.93 mg/dL (ref 0.61–1.24)
GFR, Estimated: >60 mL/min (ref >60)
Glucose, Bld: 109 mg/dL — ABNORMAL HIGH (ref 70–99)
Potassium: 3.4 mmol/L — ABNORMAL LOW (ref 3.5–5.1)
Sodium: 132 mmol/L — ABNORMAL LOW (ref 135–145)
Total Bilirubin: 1.4 mg/dL — ABNORMAL HIGH (ref 0.3–1.2)
Total Protein: 7.3 g/dL (ref 6.5–8.1)

## 2022-01-20 LAB — CBC WITH DIFFERENTIAL/PLATELET
Abs Immature Granulocytes: 0.08 10*3/uL — ABNORMAL HIGH (ref 0.00–0.07)
Basophils Absolute: 0 10*3/uL (ref 0.0–0.1)
Basophils Relative: 0 %
Eosinophils Absolute: 0 10*3/uL (ref 0.0–0.5)
Eosinophils Relative: 0 %
HCT: 45.1 % (ref 39.0–52.0)
Hemoglobin: 15.4 g/dL (ref 13.0–17.0)
Immature Granulocytes: 1 %
Lymphocytes Relative: 9 %
Lymphs Abs: 1.1 10*3/uL (ref 0.7–4.0)
MCH: 31.6 pg (ref 26.0–34.0)
MCHC: 34.1 g/dL (ref 30.0–36.0)
MCV: 92.6 fL (ref 80.0–100.0)
Monocytes Absolute: 0.9 10*3/uL (ref 0.1–1.0)
Monocytes Relative: 7 %
Neutro Abs: 10.6 10*3/uL — ABNORMAL HIGH (ref 1.7–7.7)
Neutrophils Relative %: 83 %
Platelets: 191 10*3/uL (ref 150–400)
RBC: 4.87 MIL/uL (ref 4.22–5.81)
RDW: 12.2 % (ref 11.5–15.5)
WBC: 12.8 10*3/uL — ABNORMAL HIGH (ref 4.0–10.5)
nRBC: 0 % (ref 0.0–0.2)

## 2022-01-20 LAB — LIPASE, BLOOD: Lipase: 33 U/L (ref 11–51)

## 2022-01-20 MED ORDER — IOHEXOL 300 MG/ML  SOLN
100.0000 mL | Freq: Once | INTRAMUSCULAR | Status: AC | PRN
  Administered 2022-01-20: 100 mL via INTRAVENOUS

## 2022-01-20 NOTE — Discharge Instructions (Addendum)
Fortunately your evaluation was reassuring with no evidence of any severe injuries.  Please use over-the-counter ibuprofen and/or Tylenol according to label instructions, get up and move around, stretch, and use either heating pads or cool packs (whichever feels better).  You can expect to be very sore for the next few days.  Follow-up with your regular doctor.  Return to the emergency department if you develop new or worsening symptoms that concern you.

## 2022-01-20 NOTE — ED Notes (Signed)
Pt standing in room, pt states that he is ready to go home, sig other at bedside, pt verbalized understanding d/c and follow up, pt ambulatory from dpt.

## 2022-01-20 NOTE — ED Provider Notes (Signed)
[facility] Provider Note    Event Date/Time   First MD Initiated Contact with Patient 01/20/22 2033     (approximate)   History   Back Pain   HPI {Remember to add pertinent medical, surgical, social, and/or OB history to HPI:1} Raymond Woods. is a 56 y.o. male  ***       Physical Exam   Triage Vital Signs: ED Triage Vitals  Enc Vitals Group     BP 01/20/22 1456 (!) 128/93     Pulse Rate 01/20/22 1456 76     Resp 01/20/22 1456 18     Temp 01/20/22 1456 98.5 F (36.9 C)     Temp Source 01/20/22 1456 Oral     SpO2 01/20/22 1456 98 %     Weight 01/20/22 1457 108.9 kg (240 lb)     Height 01/20/22 1457 1.702 m (5\' 7" )     Head Circumference --      Peak Flow --      Pain Score 01/20/22 1457 7     Pain Loc --      Pain Edu? --      Excl. in GC? --     Most recent vital signs: Vitals:   01/20/22 1456  BP: (!) 128/93  Pulse: 76  Resp: 18  Temp: 98.5 F (36.9 C)  SpO2: 98%    {Only need to document appropriate and relevant physical exam:1} General: Awake, no distress. *** CV:  Good peripheral perfusion. *** Resp:  Normal effort. *** Abd:  No distention. *** Other:  ***   ED Results / Procedures / Treatments   Labs (all labs ordered are listed, but only abnormal results are displayed) Labs Reviewed  CBC WITH DIFFERENTIAL/PLATELET - Abnormal; Notable for the following components:      Result Value   WBC 12.8 (*)    Neutro Abs 10.6 (*)    Abs Immature Granulocytes 0.08 (*)    All other components within normal limits  COMPREHENSIVE METABOLIC PANEL - Abnormal; Notable for the following components:   Sodium 132 (*)    Potassium 3.4 (*)    Chloride 96 (*)    Glucose, Bld 109 (*)    Total Bilirubin 1.4 (*)    All other components within normal limits  LIPASE, BLOOD     EKG  ***   RADIOLOGY *** {USE THE WORD "INTERPRETED"!! You MUST document your own interpretation of imaging, as well as the fact that you  reviewed the radiologist's report!:1}   PROCEDURES:  Critical Care performed: {CriticalCareYesNo:19197::"Yes, see critical care procedure note(s)","No"}  Procedures   MEDICATIONS ORDERED IN ED: Medications  iohexol (OMNIPAQUE) 300 MG/ML solution 100 mL (100 mLs Intravenous Contrast Given 01/20/22 1930)     IMPRESSION / MDM / ASSESSMENT AND PLAN / ED COURSE  I reviewed the triage vital signs and the nursing notes.                              Differential diagnosis includes, but is not limited to, ***  Patient's presentation is most consistent with {EM COPA:27473}  {If the patient is on the monitor, remove the brackets and asterisks on the sentence below and remember to document it as a Procedure as well. Otherwise delete the sentence below:1} {**The patient is on the cardiac monitor to evaluate for evidence of arrhythmia and/or significant heart rate changes.**} {Remember to include, when applicable, any/all of  the following data: independent review of imaging independent review of labs (comment specifically on pertinent positives and negatives) review of specific prior hospitalizations, PCP/specialist notes, etc. discuss meds given and prescribed document any discussion with consultants (including hospitalists) any clinical decision tools you used and why (PECARN, NEXUS, etc.) did you consider admitting the patient? document social determinants of health affecting patient's care (homelessness, inability to follow up in a timely fashion, etc) document any pre-existing conditions increasing risk on current visit (e.g. diabetes and HTN increasing danger of high-risk chest pain/ACS) describes what meds you gave (especially parenteral) and why any other interventions?:1}     FINAL CLINICAL IMPRESSION(S) / ED DIAGNOSES   Final diagnoses:  None     Rx / DC Orders   ED Discharge Orders     None        Note:  This document was prepared using Dragon voice recognition  software and may include unintentional dictation errors.
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
## 2022-04-11 ENCOUNTER — Other Ambulatory Visit: Payer: Self-pay | Admitting: Family Medicine

## 2022-04-11 DIAGNOSIS — I10 Essential (primary) hypertension: Secondary | ICD-10-CM

## 2022-04-11 DIAGNOSIS — R739 Hyperglycemia, unspecified: Secondary | ICD-10-CM

## 2022-04-14 ENCOUNTER — Other Ambulatory Visit (INDEPENDENT_AMBULATORY_CARE_PROVIDER_SITE_OTHER): Payer: Managed Care, Other (non HMO)

## 2022-04-14 DIAGNOSIS — I10 Essential (primary) hypertension: Secondary | ICD-10-CM | POA: Diagnosis not present

## 2022-04-14 DIAGNOSIS — R739 Hyperglycemia, unspecified: Secondary | ICD-10-CM | POA: Diagnosis not present

## 2022-04-14 LAB — COMPREHENSIVE METABOLIC PANEL
ALT: 16 U/L (ref 0–53)
AST: 15 U/L (ref 0–37)
Albumin: 4.1 g/dL (ref 3.5–5.2)
Alkaline Phosphatase: 71 U/L (ref 39–117)
BUN: 11 mg/dL (ref 6–23)
CO2: 29 mEq/L (ref 19–32)
Calcium: 9 mg/dL (ref 8.4–10.5)
Chloride: 104 mEq/L (ref 96–112)
Creatinine, Ser: 0.94 mg/dL (ref 0.40–1.50)
GFR: 91.84 mL/min (ref >60.00)
Glucose, Bld: 97 mg/dL (ref 70–99)
Potassium: 4.7 mEq/L (ref 3.5–5.1)
Sodium: 139 mEq/L (ref 135–145)
Total Bilirubin: 0.5 mg/dL (ref 0.2–1.2)
Total Protein: 6.9 g/dL (ref 6.0–8.3)

## 2022-04-14 LAB — LIPID PANEL
Cholesterol: 190 mg/dL (ref 0–200)
HDL: 53.4 mg/dL (ref >39.00)
LDL Cholesterol: 113 mg/dL — ABNORMAL HIGH (ref 0–99)
NonHDL: 136.39
Total CHOL/HDL Ratio: 4
Triglycerides: 119 mg/dL (ref 0.0–149.0)
VLDL: 23.8 mg/dL (ref 0.0–40.0)

## 2022-04-14 LAB — HEMOGLOBIN A1C: Hgb A1c MFr Bld: 5.7 % (ref 4.6–6.5)

## 2022-04-21 ENCOUNTER — Other Ambulatory Visit: Payer: Self-pay | Admitting: Family Medicine

## 2022-04-22 ENCOUNTER — Encounter: Payer: Self-pay | Admitting: Family Medicine

## 2022-04-22 ENCOUNTER — Ambulatory Visit (INDEPENDENT_AMBULATORY_CARE_PROVIDER_SITE_OTHER): Payer: Managed Care, Other (non HMO) | Admitting: Family Medicine

## 2022-04-22 VITALS — BP 124/80 | HR 84 | Temp 97.4°F | Ht 67.0 in | Wt 259.0 lb

## 2022-04-22 DIAGNOSIS — Z0000 Encounter for general adult medical examination without abnormal findings: Secondary | ICD-10-CM | POA: Diagnosis not present

## 2022-04-22 DIAGNOSIS — Z7189 Other specified counseling: Secondary | ICD-10-CM

## 2022-04-22 DIAGNOSIS — Z1211 Encounter for screening for malignant neoplasm of colon: Secondary | ICD-10-CM

## 2022-04-22 DIAGNOSIS — I10 Essential (primary) hypertension: Secondary | ICD-10-CM

## 2022-04-22 NOTE — Progress Notes (Signed)
CPE- See plan.  Routine anticipatory guidance given to patient.  See health maintenance.  The possibility exists that previously documented standard health maintenance information may have been brought forward from a previous encounter into this note.  If needed, that same information has been updated to reflect the current situation based on today's encounter.    Tetanus 2017. PNA not due yet, d/w pt.  Flu shot encouraged. D/w pt.  Shingles d/w pt.   covid vaccine prev done.   Prostate cancer screening and PSA options (with potential risks and benefits of testing vs not testing) were discussed along with recent recs/guidelines.  He declined testing PSA at this point. D/w patient [id number] for colon cancer screening, including IFOB vs. colonoscopy.  Risks and benefits of both were discussed and patient voiced understanding.  Pt elects for: cologuard.   Living will d/w pt. Wife designated if patient were incapacitated.  Diet and exercise d/w pt.   HIV testing d/w pt. Per patient, was neg on testing with outside labs ~2014. D/w pt about dip tobacco, encouraged cessation.  He is considering options.   Hypertension:    Using medication without problems or lightheadedness: yes Chest pain with exertion:no Edema:no Short of breath:no Labs d/w pt.   D/w pt about OSA screening.  He'll consider.  I advised him to get screened.  PMH and SH reviewed  Meds, vitals, and allergies reviewed.   ROS: Per HPI.  Unless specifically indicated otherwise in HPI, the patient denies:  General: fever. Eyes: acute vision changes ENT: sore throat Cardiovascular: chest pain Respiratory: SOB GI: vomiting GU: dysuria Musculoskeletal: acute back pain Derm: acute rash Neuro: acute motor dysfunction Psych: worsening mood Endocrine: polydipsia Heme: bleeding Allergy: hayfever  GEN: nad, alert and oriented HEENT: ncat NECK: supple w/o LA CV: rrr. PULM: ctab, no inc wob ABD: soft, +bs EXT: no  edema SKIN: no acute rash  The 10-year ASCVD risk score (Arnett DK, et al., 2019) is: 5%   Values used to calculate the score:     Age: 56 years     Sex: Male     Is Non-Hispanic African American: No     Diabetic: No     Tobacco smoker: No     Systolic Blood Pressure: 846 mmHg     Is BP treated: Yes     HDL Cholesterol: 53.4 mg/dL     Total Cholesterol: 190 mg/dL

## 2022-04-22 NOTE — Patient Instructions (Signed)
I would get a flu shot each fall.   Take care.  Glad to see you.  Update me as needed.  

## 2022-04-25 NOTE — Assessment & Plan Note (Signed)
Advised OSA screening.  Continue work on diet and exercise.  Continue amlodipine and hydrochlorothiazide and metoprolol.

## 2022-04-25 NOTE — Assessment & Plan Note (Signed)
Tetanus 2017. PNA not due yet, d/w pt.  Flu shot encouraged. D/w pt.  Shingles d/w pt.   covid vaccine prev done.   Prostate cancer screening and PSA options (with potential risks and benefits of testing vs not testing) were discussed along with recent recs/guidelines.  He declined testing PSA at this point. D/w patient QM:[id number] for colon cancer screening, including IFOB vs. colonoscopy.  Risks and benefits of both were discussed and patient voiced understanding.  Pt elects for: cologuard.   Living will d/w pt. Wife designated if patient were incapacitated.  Diet and exercise d/w pt.   HIV testing d/w pt. Per patient, was neg on testing with outside labs ~2014. D/w pt about dip tobacco, encouraged cessation.  He is considering options.

## 2022-04-25 NOTE — Assessment & Plan Note (Signed)
Living will d/w pt.  Wife designated if patient were incapacitated.   ?

## 2022-06-25 IMAGING — CT CT ABD-PELV W/O CM
2 of 4 series · 17 of 46 positions shown, 19 images · non-contrast
Comparison: None.

CLINICAL DATA: Hematuria

EXAM:
CT ABDOMEN AND PELVIS WITHOUT CONTRAST
TECHNIQUE: Multidetector CT imaging of the abdomen and pelvis was performed
following the standard protocol without IV contrast.

[Series 3: abd/ pelvis 5.0 i30f 2 · axial · 0.93mm/px · z∈[-670,-230]mm · 14 of 98 slices shown, 16 images]
[im 5/98  soft-tissue]
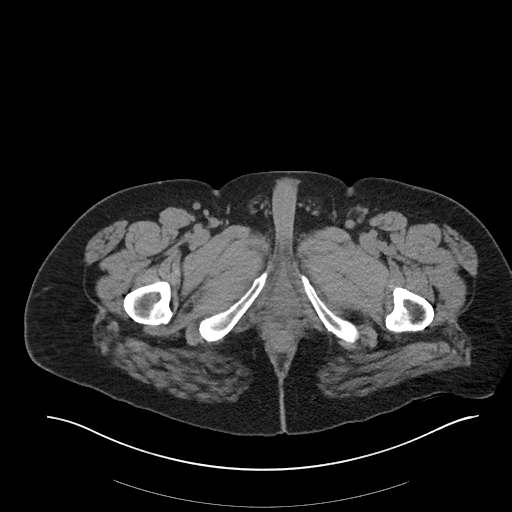
[im 5/98  bone]
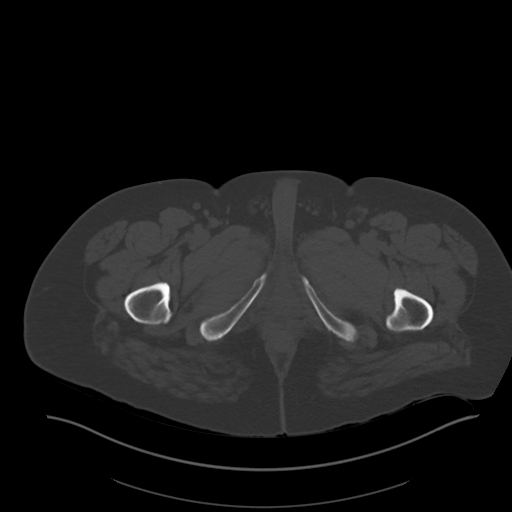
[im 13/98  soft-tissue]
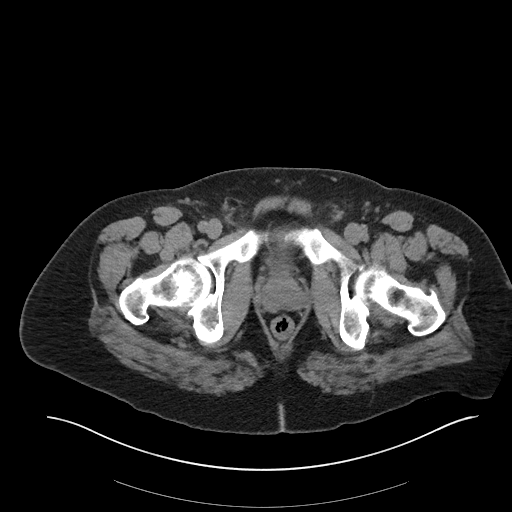
[im 17/98  soft-tissue]
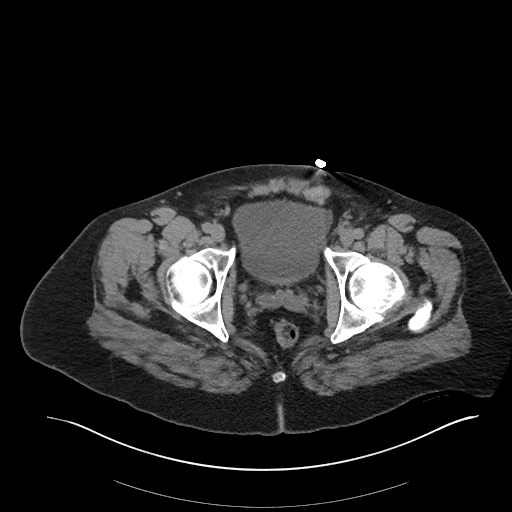
[im 26/98  soft-tissue]
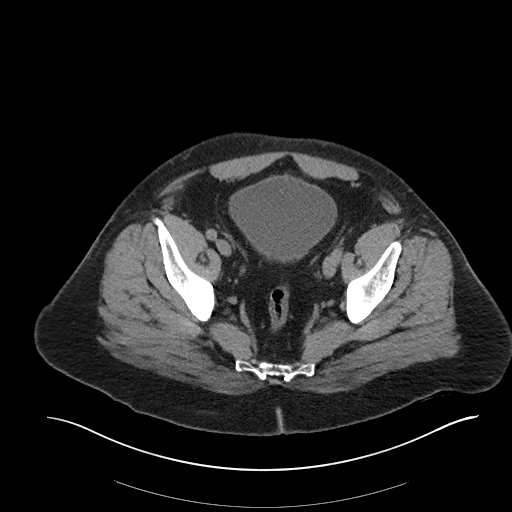
[im 34/98  soft-tissue]
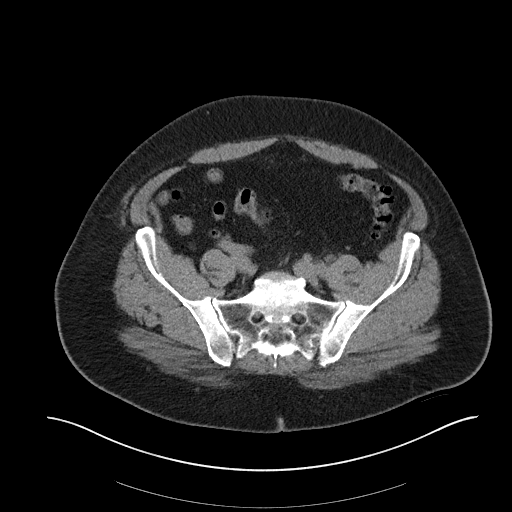
[im 38/98  soft-tissue]
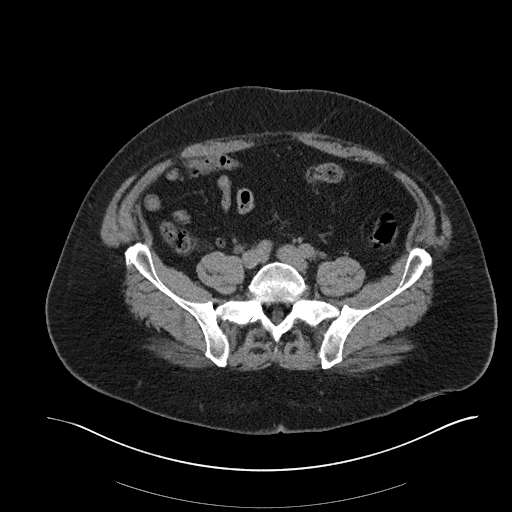
[im 47/98  soft-tissue]
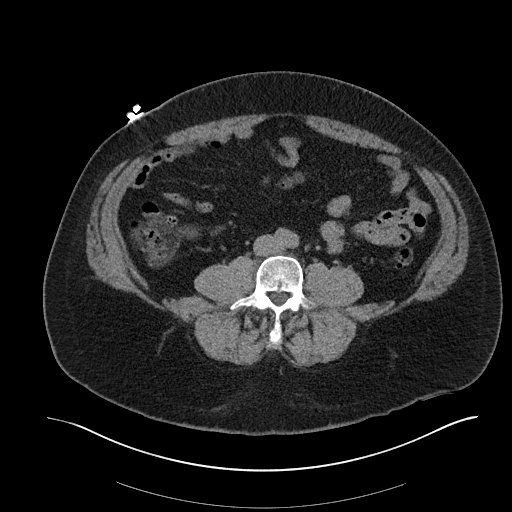
[im 51/98  soft-tissue]
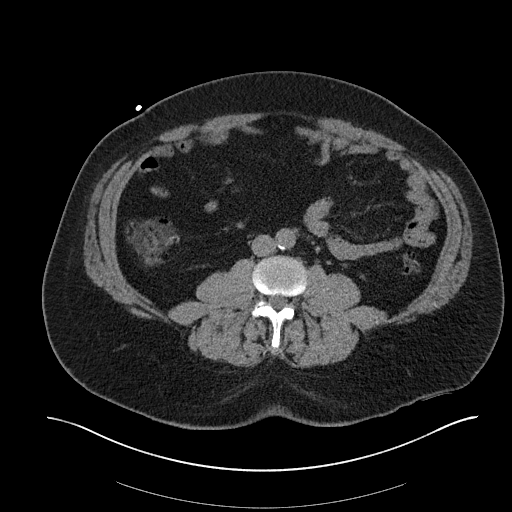
[im 60/98  soft-tissue]
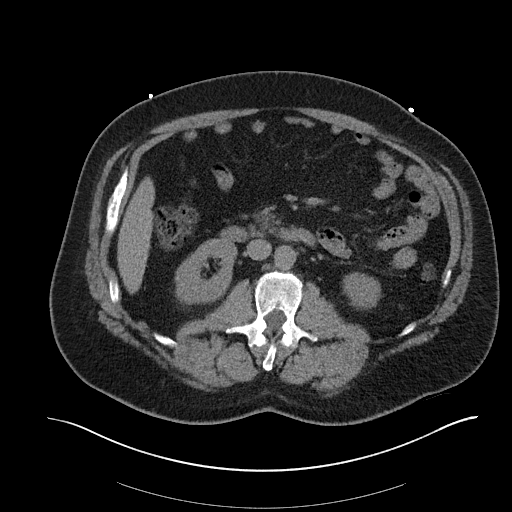
[im 60/98  bone]
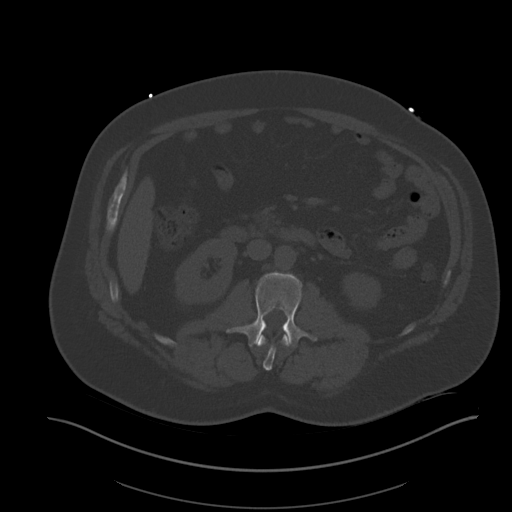
[im 64/98  soft-tissue]
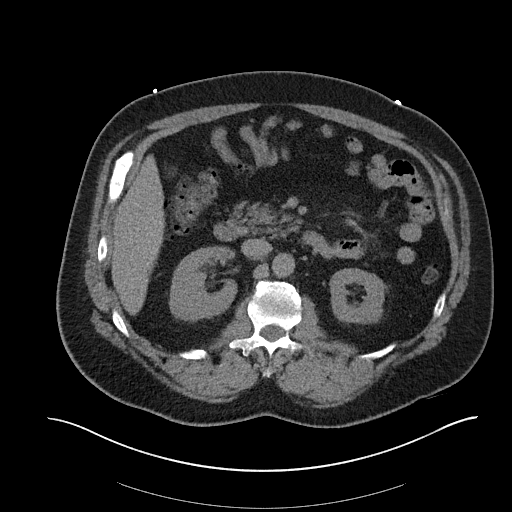
[im 72/98  soft-tissue]
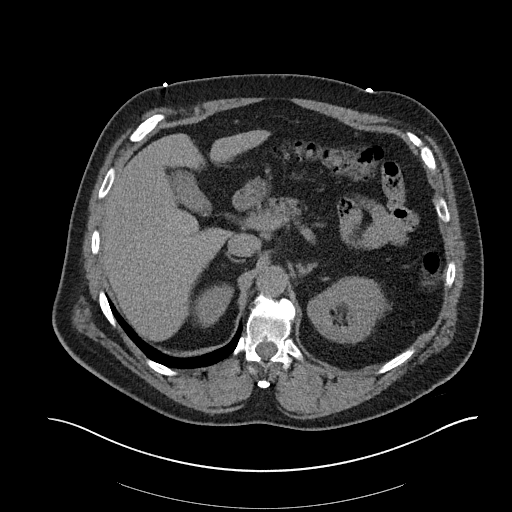
[im 81/98  soft-tissue]
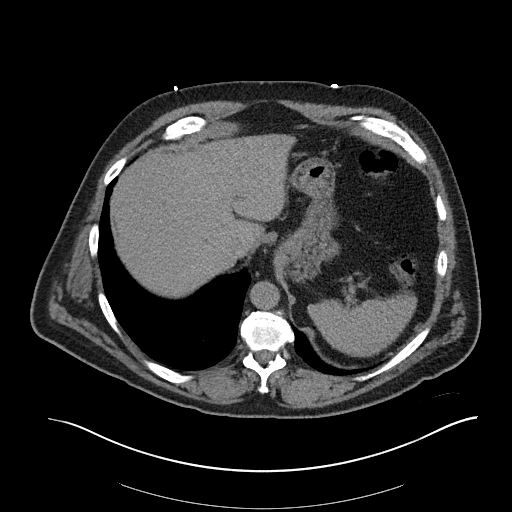
[im 85/98  soft-tissue]
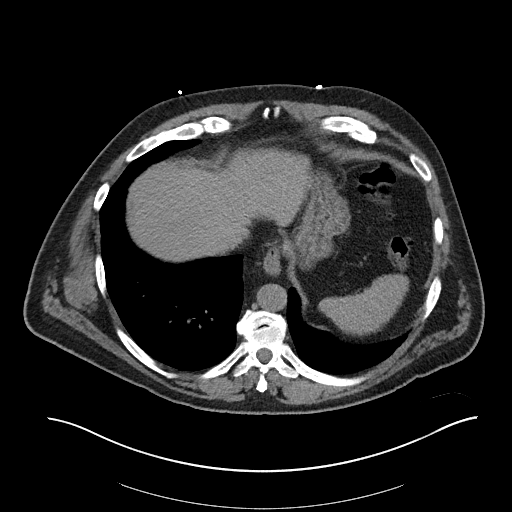
[im 93/98  soft-tissue]
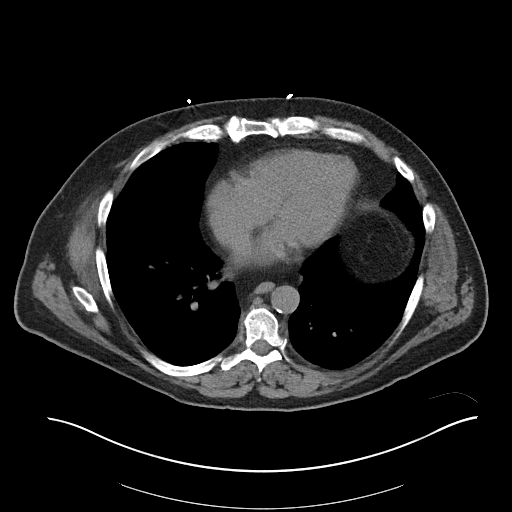

[Series 6: cor st · coronal · 0.86mm/px · 3 of 111 slices shown]
[im 37/111  soft-tissue]
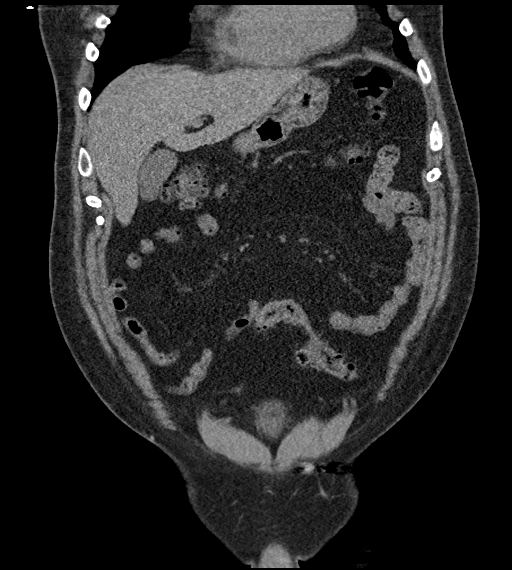
[im 49/111  soft-tissue]
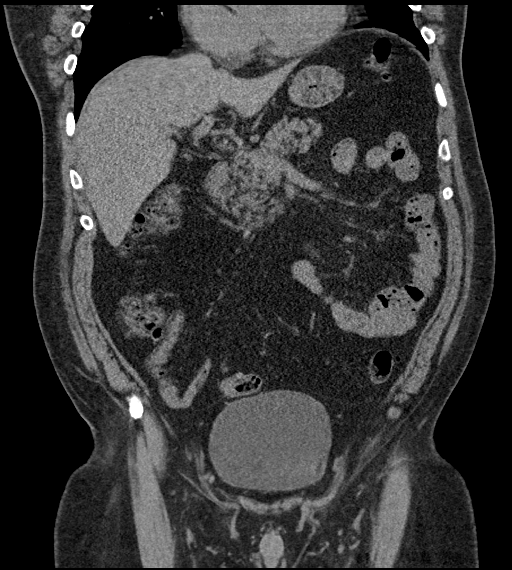
[im 62/111  soft-tissue]
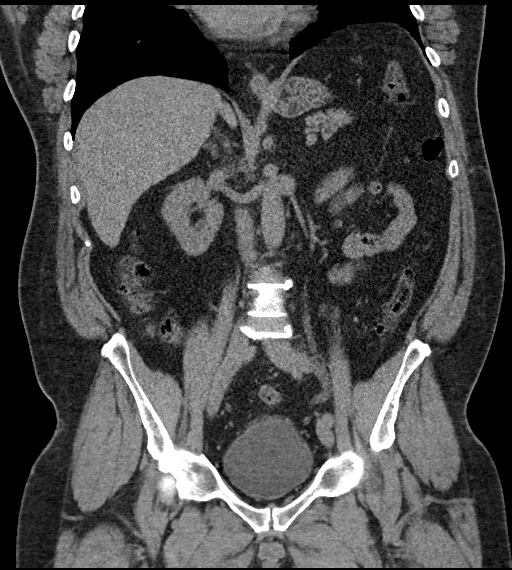

[17 of 46 positions shown; findings below may reference images not displayed]

FINDINGS: Lower chest: No acute pleural or parenchymal lung disease.

Hepatobiliary: No focal liver abnormality is seen. No gallstones,
gallbladder wall thickening, or biliary dilatation.

Pancreas: Unremarkable. No pancreatic ductal dilatation or
surrounding inflammatory changes.

Spleen: Normal in size without focal abnormality.

Adrenals/Urinary Tract: Adrenal glands are unremarkable. Kidneys are
normal, without renal calculi, focal lesion, or hydronephrosis.
Bladder is unremarkable.

Stomach/Bowel: No bowel obstruction or ileus. Diffuse colonic
diverticulosis without diverticulitis. Normal appendix right lower
quadrant. No bowel wall thickening or inflammatory change.

Vascular/Lymphatic: Aortic atherosclerosis. No enlarged abdominal or
pelvic lymph nodes.

Reproductive: Prostate is unremarkable.

Other: No free fluid or free gas.  No abdominal wall hernia.

Musculoskeletal: No acute or destructive bony lesions. Reconstructed
images demonstrate no additional findings.
IMPRESSION: 1. No evidence of urinary tract calculi or obstructive uropathy.
2. Colonic diverticulosis without diverticulitis.
3.  Aortic Atherosclerosis ([id number]).

## 2022-07-22 ENCOUNTER — Telehealth: Payer: Self-pay | Admitting: Family Medicine

## 2022-07-22 MED ORDER — METOPROLOL SUCCINATE ER 100 MG PO TB24: 100.0000 mg | ORAL_TABLET | Freq: Every day | ORAL | 3 refills | Status: DC

## 2022-07-22 NOTE — Telephone Encounter (Signed)
Erx sent

## 2022-07-22 NOTE — Telephone Encounter (Signed)
Prescription Request  07/22/2022  Is this a "Controlled Substance" medicine? No  LOV: 04/22/2022  What is the name of the medication or equipment?  metoprolol succinate (TOPROL-XL) 100 MG 24 hr tablet      Have you contacted your pharmacy to request a refill? No   Which pharmacy would you like this sent to?  [id number] - [place], [place] - [address] [place] [address] Phone: [phone] Fax: [phone]      Patient notified that their request is being sent to the clinical staff for review and that they should receive a response within 2 business days.   Please advise at Mobile [phone] (mobile)

## 2022-07-22 NOTE — Addendum Note (Signed)
Addended by: Sherrilee Gilles B on: 07/22/2022 12:28 PM   Modules accepted: Orders

## 2022-10-10 ENCOUNTER — Other Ambulatory Visit: Payer: Self-pay | Admitting: Internal Medicine

## 2022-10-11 NOTE — Telephone Encounter (Signed)
Please schedule F/U appointment for 90 day refills. Thank you! 
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
## 2022-11-09 ENCOUNTER — Ambulatory Visit: Payer: Managed Care, Other (non HMO) | Attending: Cardiology | Admitting: Cardiology

## 2022-11-09 ENCOUNTER — Encounter: Payer: Self-pay | Admitting: Cardiology

## 2022-11-09 VITALS — BP 144/86 | HR 65 | Ht 68.0 in | Wt 267.0 lb

## 2022-11-09 DIAGNOSIS — I10 Essential (primary) hypertension: Secondary | ICD-10-CM

## 2022-11-09 DIAGNOSIS — I4510 Unspecified right bundle-branch block: Secondary | ICD-10-CM

## 2022-11-09 NOTE — Patient Instructions (Signed)
Medication Instructions:  - Your physician recommends that you continue on your current medications as directed. Please refer to the Current Medication list given to you today.  *If you need a refill on your cardiac medications before your next appointment, please call your pharmacy*   Lab Work: - none ordered  If you have labs (blood work) drawn today and your tests are completely normal, you will receive your results only by: MyChart Message (if you have MyChart) OR A paper copy in the mail If you have any lab test that is abnormal or we need to change your treatment, we will call you to review the results.   Testing/Procedures: - none ordered   Follow-Up: At [facility], you and your health needs are our priority.  As part of our continuing mission to provide you with exceptional heart care, we have created designated Provider Care Teams.  These Care Teams include your primary Cardiologist (physician) and Advanced Practice Providers (APPs -  Physician Assistants and Nurse Practitioners) who all work together to provide you with the care you need, when you need it.  We recommend signing up for the patient portal called "MyChart".  Sign up information is provided on this After Visit Summary.  MyChart is used to connect with patients for Virtual Visits (Telemedicine).  Patients are able to view lab/test results, encounter notes, upcoming appointments, etc.  Non-urgent messages can be sent to your provider as well.   To learn more about what you can do with MyChart, go to [email or web address].    Your next appointment:   1 year(s)  Provider:   You may see Yvonne Kendall, MD or one of the following Advanced Practice Providers on your designated Care Team:    Charlsie Quest, NP    Other Instructions N/a

## 2023-01-07 ENCOUNTER — Other Ambulatory Visit: Payer: Self-pay | Admitting: Internal Medicine

## 2023-04-17 ENCOUNTER — Other Ambulatory Visit: Payer: Self-pay | Admitting: Family Medicine

## 2023-04-17 DIAGNOSIS — R739 Hyperglycemia, unspecified: Secondary | ICD-10-CM

## 2023-04-17 DIAGNOSIS — I10 Essential (primary) hypertension: Secondary | ICD-10-CM

## 2023-04-21 ENCOUNTER — Other Ambulatory Visit (INDEPENDENT_AMBULATORY_CARE_PROVIDER_SITE_OTHER): Payer: Managed Care, Other (non HMO)

## 2023-04-21 DIAGNOSIS — I10 Essential (primary) hypertension: Secondary | ICD-10-CM

## 2023-04-21 DIAGNOSIS — R739 Hyperglycemia, unspecified: Secondary | ICD-10-CM | POA: Diagnosis not present

## 2023-04-21 LAB — COMPREHENSIVE METABOLIC PANEL
ALT: 25 U/L (ref 0–53)
AST: 22 U/L (ref 0–37)
Albumin: 4.3 g/dL (ref 3.5–5.2)
Alkaline Phosphatase: 73 U/L (ref 39–117)
BUN: 8 mg/dL (ref 6–23)
CO2: 28 meq/L (ref 19–32)
Calcium: 9 mg/dL (ref 8.4–10.5)
Chloride: 101 meq/L (ref 96–112)
Creatinine, Ser: 0.82 mg/dL (ref 0.40–1.50)
GFR: 98.81 mL/min (ref >60.00)
Glucose, Bld: 88 mg/dL (ref 70–99)
Potassium: 4.2 meq/L (ref 3.5–5.1)
Sodium: 138 meq/L (ref 135–145)
Total Bilirubin: 0.7 mg/dL (ref 0.2–1.2)
Total Protein: 6.8 g/dL (ref 6.0–8.3)

## 2023-04-21 LAB — LIPID PANEL
Cholesterol: 200 mg/dL (ref 0–200)
HDL: 66.7 mg/dL (ref >39.00)
LDL Cholesterol: 114 mg/dL — ABNORMAL HIGH (ref 0–99)
NonHDL: 133.41
Total CHOL/HDL Ratio: 3
Triglycerides: 98 mg/dL (ref 0.0–149.0)
VLDL: 19.6 mg/dL (ref 0.0–40.0)

## 2023-04-21 LAB — HEMOGLOBIN A1C: Hgb A1c MFr Bld: 5.6 % (ref 4.6–6.5)

## 2023-04-28 ENCOUNTER — Encounter: Payer: Self-pay | Admitting: Family Medicine

## 2023-04-28 ENCOUNTER — Ambulatory Visit (INDEPENDENT_AMBULATORY_CARE_PROVIDER_SITE_OTHER): Payer: Managed Care, Other (non HMO) | Admitting: Family Medicine

## 2023-04-28 VITALS — BP 116/84 | HR 71 | Temp 98.2°F | Ht 68.0 in | Wt 259.0 lb

## 2023-04-28 DIAGNOSIS — I10 Essential (primary) hypertension: Secondary | ICD-10-CM

## 2023-04-28 DIAGNOSIS — Z1211 Encounter for screening for malignant neoplasm of colon: Secondary | ICD-10-CM

## 2023-04-28 DIAGNOSIS — Z7189 Other specified counseling: Secondary | ICD-10-CM

## 2023-04-28 DIAGNOSIS — Z0000 Encounter for general adult medical examination without abnormal findings: Secondary | ICD-10-CM

## 2023-04-28 MED ORDER — METOPROLOL SUCCINATE ER 100 MG PO TB24: 100.0000 mg | ORAL_TABLET | Freq: Every day | ORAL | 3 refills | Status: DC

## 2023-04-28 NOTE — Assessment & Plan Note (Signed)
Living will d/w pt.  Wife designated if patient were incapacitated.   ?

## 2023-04-28 NOTE — Assessment & Plan Note (Signed)
Tetanus 2017. PNA not due yet, d/w pt.  Flu shot encouraged. D/w pt.  Shingles d/w pt.   covid vaccine encouraged.  Prostate cancer screening and PSA options (with potential risks and benefits of testing vs not testing) were discussed along with recent recs/guidelines.  He declined testing PSA at this point. D/w patient [id number] for colon cancer screening, including IFOB vs. colonoscopy.  Risks and benefits of both were discussed and patient voiced understanding.  Pt elects for: cologuard.   Living will d/w pt. Wife designated if patient were incapacitated.  Diet and exercise d/w pt.   HIV testing d/w pt. Per patient, was neg on testing with outside labs ~2014. D/w pt about dip tobacco, encouraged cessation.  He is considering options.  HCV screening prev done 1990s.  [facility]

## 2023-04-28 NOTE — Assessment & Plan Note (Signed)
D/w pt about labs, diet, sleep, exercise.  Continue amlodipine, hydrochlorothiazide and metoprolol.

## 2023-04-28 NOTE — Progress Notes (Signed)
CPE- See plan.  Routine anticipatory guidance given to patient.  See health maintenance.  The possibility exists that previously documented standard health maintenance information may have been brought forward from a previous encounter into this note.  If needed, that same information has been updated to reflect the current situation based on today's encounter.    Tetanus 2017. PNA not due yet, d/w pt.  Flu shot encouraged. D/w pt.  Shingles d/w pt.   covid vaccine encouraged.  Prostate cancer screening and PSA options (with potential risks and benefits of testing vs not testing) were discussed along with recent recs/guidelines.  He declined testing PSA at this point. D/w patient [id number] for colon cancer screening, including IFOB vs. colonoscopy.  Risks and benefits of both were discussed and patient voiced understanding.  Pt elects for: cologuard.   Living will d/w pt. Wife designated if patient were incapacitated.  Diet and exercise d/w pt.   HIV testing d/w pt. Per patient, was neg on testing with outside labs ~2014. D/w pt about dip tobacco, encouraged cessation.  He is considering options.  HCV screening prev done 1990s.  [facility]    Hypertension:    Using medication without problems or lightheadedness: yes Chest pain with exertion: attributed to weight by patient but not an acute change, d/w pt.  Edema: noted with inc salt intake.   Short of breath: no Labs d/w pt.    PMH and SH reviewed  Meds, vitals, and allergies reviewed.   ROS: Per HPI.  Unless specifically indicated otherwise in HPI, the patient denies:  General: fever. Eyes: acute vision changes ENT: sore throat Cardiovascular: chest pain Respiratory: SOB GI: vomiting GU: dysuria Musculoskeletal: acute back pain Derm: acute rash Neuro: acute motor dysfunction Psych: worsening mood Endocrine: polydipsia Heme: bleeding Allergy: hayfever  GEN: nad, alert and oriented HEENT: ncat NECK: supple w/o  LA CV: rrr. PULM: ctab, no inc wob ABD: soft, +bs EXT: no edema SKIN: well perfused.

## 2023-04-28 NOTE — Patient Instructions (Addendum)
Let me know if you don't get a new cologuard kit.  Take care.  Glad to see you. Update me as needed.

## 2023-05-24 NOTE — Progress Notes (Unsigned)
    Raymond Woods T. Coltyn Hanning, MD, CAQ Sports Medicine Raymond Woods [address] [place], [address]  Phone: [phone]  FAX: [phone]  Raymond Woods. - 57 y.o. male  MRN [id number]  Date of Birth: [birth year]  Date: 05/25/2023  PCP: Joaquim Nam, MD  Referral: Joaquim Nam, MD  No chief complaint on file.  Subjective:   Raymond Huston. is a 57 y.o. very pleasant male patient with There is no height or weight on file to calculate BMI. who presents with the following:  The patient presents with ongoing radicular back pain.    Review of Systems is noted in the HPI, as appropriate  Objective:   There were no vitals taken for this visit.  GEN: No acute distress; alert,appropriate. PULM: Breathing comfortably in no respiratory distress PSYCH: Normally interactive.   Laboratory and Imaging Data:  Assessment and Plan:   ***

## 2023-05-25 ENCOUNTER — Ambulatory Visit: Payer: Managed Care, Other (non HMO) | Admitting: Family Medicine

## 2023-05-25 ENCOUNTER — Encounter: Payer: Self-pay | Admitting: Family Medicine

## 2023-05-25 VITALS — BP 130/70 | HR 71 | Temp 97.9°F | Ht 68.0 in | Wt 260.2 lb

## 2023-05-25 DIAGNOSIS — M5137 Other intervertebral disc degeneration, lumbosacral region with discogenic back pain only: Secondary | ICD-10-CM | POA: Diagnosis not present

## 2023-05-25 DIAGNOSIS — M5416 Radiculopathy, lumbar region: Secondary | ICD-10-CM

## 2023-05-25 MED ORDER — PREDNISONE 20 MG PO TABS: ORAL_TABLET | ORAL | 0 refills | Status: DC

## 2023-05-25 MED ORDER — TIZANIDINE HCL 4 MG PO TABS: 4.0000 mg | ORAL_TABLET | Freq: Every evening | ORAL | 1 refills | Status: DC | PRN

## 2023-07-03 ENCOUNTER — Other Ambulatory Visit: Payer: Self-pay | Admitting: Family Medicine

## 2023-07-19 ENCOUNTER — Other Ambulatory Visit: Payer: Self-pay | Admitting: Family Medicine

## 2023-07-19 NOTE — Telephone Encounter (Signed)
Last office visit 05/25/2023 with Dr. Patsy Lager for lumbar radiculopathy.  Last refilled 05/25/2023 for #30 with 1 refill.  Next Appt: CPE 04/30/2024 with PCP.

## 2023-09-17 ENCOUNTER — Other Ambulatory Visit: Payer: Self-pay | Admitting: Family Medicine

## 2023-09-19 NOTE — Telephone Encounter (Signed)
 Last office visit 05/25/23 with Dr. Patsy Lager for Lumbar Radiculopath.  Last refilled 07/19/2023 for #30 with 1 refill.  Next Appt: CPE 04/30/24 with PCP.

## 2023-11-14 ENCOUNTER — Other Ambulatory Visit: Payer: Self-pay | Admitting: Family Medicine

## 2023-11-14 NOTE — Telephone Encounter (Signed)
 Last office visit 05/25/2023 with Dr. Geralyn Knee for lumbar radiculopathy.  Last refilled 09/19/2023 for #30 with 1 refill.  Next Appt: CPE 04/30/2024 with PCP.

## 2023-12-23 ENCOUNTER — Encounter: Payer: Self-pay | Admitting: Internal Medicine

## 2023-12-26 ENCOUNTER — Other Ambulatory Visit: Payer: Self-pay | Admitting: Internal Medicine

## 2024-01-26 ENCOUNTER — Other Ambulatory Visit: Payer: Self-pay | Admitting: Internal Medicine

## 2024-01-26 NOTE — Telephone Encounter (Signed)
Please contact pt for future appointment. Pt overdue for follow up.

## 2024-01-26 NOTE — Telephone Encounter (Signed)
Pt scheduled on 7/14

## 2024-01-30 ENCOUNTER — Ambulatory Visit: Attending: Cardiology | Admitting: Cardiology

## 2024-01-30 ENCOUNTER — Encounter: Payer: Self-pay | Admitting: Cardiology

## 2024-01-30 VITALS — BP 140/80 | HR 80 | Ht 69.0 in | Wt 255.6 lb

## 2024-01-30 DIAGNOSIS — I10 Essential (primary) hypertension: Secondary | ICD-10-CM | POA: Diagnosis not present

## 2024-01-30 DIAGNOSIS — I4510 Unspecified right bundle-branch block: Secondary | ICD-10-CM

## 2024-01-30 DIAGNOSIS — E66813 Obesity, class 3: Secondary | ICD-10-CM

## 2024-01-30 DIAGNOSIS — E7800 Pure hypercholesterolemia, unspecified: Secondary | ICD-10-CM | POA: Diagnosis not present

## 2024-01-30 NOTE — Progress Notes (Signed)
 Cardiology Office Note   Date:  01/30/2024  ID:  Tanda KANDICE Michaela Mickey., DOB Sep 15, 1965, MRN [id number] PCP: Cleatus Arlyss RAMAN, MD  [facility] Providers Cardiologist:  Lonni Hanson, MD     History of Present Illness Raymond Woods. is a 58 y.o. male with a past medical history of hypertension, hepatic stenosis, abnormal EKG with right bundle branch block, obesity, who is here today for follow-up.  He was seen in clinic in April 2023 by Dr. Hanson.  At that time he reported well with some intermittent fatigue.  At that time he had a STOP-BANG score of 7 they discussed proceeding with a home sleep study.  He continued on his current medication regimen without changes at this time.  Patient was evaluated in [facility] emergency department 01/27/2022 for the complaint of back pain after being injured by a ball earlier in the day.  Had multiple CT scans that were done which showed no evidence of fracture, dislocation or internal bleeding.  Vital signs remained stable within normal limits with some mild hypertension.  He did have some mild electrolyte abnormalities noted on labs but overall reassuring he was subsequently discharged home.  He was last seen in clinic in April 2024 for was doing fairly well from a cardiac perspective.  He denied any hospitalizations or visits to the emergency department.  Continue to follow with his PCP.  There were no medication changes that were made or further testing that was ordered at that time.  He returns to clinic today stating that he has been doing well from a cardiac perspective.  He denies any chest pain, shortness of breath, dyspnea on exertion, or palpitations.  He occasionally has some swelling to his bilateral lower extremities but no change.  He previously was on an increased amount of amlodipine  which had to be reduced due to lower extremity edema.  States that he has been compliant with his current medication regimen without any undue side  effects.  Denies any hospitalizations or visits to the emergency department.  ROS: 10 point review of systems has been reviewed and considered negative except ones been listed with his HPI  Studies Reviewed EKG Interpretation Date/Time:  Monday January 30 2024 15:01:29 EDT Ventricular Rate:  80 PR Interval:  176 QRS Duration:  130 QT Interval:  410 QTC Calculation: 472 R Axis:   21  Text Interpretation: Normal sinus rhythm chronic Right bundle branch block No significant change since last tracing Confirmed by Gerard Frederick ([id number]) on 01/30/2024 3:21:57 PM    TTE 07/09/22 1. Left ventricular ejection fraction, by estimation, is 60 to 65%. The  left ventricle has normal function. The left ventricle has no regional  wall motion abnormalities. There is mild to moderate left ventricular  hypertrophy. Left ventricular diastolic  parameters are consistent with Grade I diastolic dysfunction (impaired  relaxation).   2. Right ventricular systolic function is normal. The right ventricular  size is normal. Tricuspid regurgitation signal is inadequate for assessing  PA pressure.   3. The mitral valve is normal in structure. No evidence of mitral valve  regurgitation. No evidence of mitral stenosis.   4. The aortic valve was not well visualized. Aortic valve regurgitation  is not visualized. No aortic stenosis is present.   5. The inferior vena cava is normal in size with greater than 50%  respiratory variability, suggesting right atrial pressure of 3 mmHg.  Risk Assessment/Calculations   HYPERTENSION CONTROL Vitals:   01/30/24 1452 01/30/24 1500  BP: (!) 148/82 (!) 140/80    The patient's blood pressure is elevated above target today.  In order to address the patient's elevated BP: Blood pressure will be monitored at home to determine if medication changes need to be made. (patient takes all his medications at night, if remains elevated with consider medication adjustments)       STOP-Bang Score:         Physical Exam VS:  BP (!) 140/80 (BP Location: Left Arm, Patient Position: Sitting, Cuff Size: Large)   Pulse 80   Ht 5' 9 (1.753 m)   Wt 255 lb 9.6 oz (115.9 kg)   BMI 37.75 kg/m        Wt Readings from Last 3 Encounters:  01/30/24 255 lb 9.6 oz (115.9 kg)  05/25/23 260 lb 4 oz (118 kg)  04/28/23 259 lb (117.5 kg)    GEN: Well nourished, well developed in no acute distress NECK: No JVD; No carotid bruits CARDIAC: RRR, no murmurs, rubs, gallops RESPIRATORY:  Clear to auscultation without rales, wheezing or rhonchi  ABDOMEN: Soft, non-tender, non-distended EXTREMITIES:  No edema; No deformity   ASSESSMENT AND PLAN Primary hypertension with blood pressure today 148/82 at 140/80.  Patient is continued on amlodipine  5 mg daily, HCTZ 25 mg daily, and Toprol -XL 100 mg daily.  States that his blood pressures is slightly elevated today where he typically runs about 120-130.  States that he has been compliant with his current medications.  Is advised to decrease sodium intake.  Encouraged to monitor pressures 1 to 2 hours postmedication administration as well.  Chronic right bundle branch block on EKG that is unchanged from prior studies.  He denies any chest discomfort or shortness of breath.  Consistent with rate 80 with a chronic right bundle EKG today.  Will continue to monitor with surveillance studies.  Morbid obesity with a BMI of 37.75.  Increasing activity and dietary modification was encouraged.  Pure hypercholesterolemia with an LDL of 114.  10-year ASCVD risk 6.6% lifetime risk 5.0%.  He has upcoming labs with his PCP.  If continues to remain elevated even with to risk enhancers he is to continue with lifestyle modification and may benefit from low-dose statin therapy.       Dispo: Patient to return to clinic to see MD/APP in 11 to 12 months or sooner if needed  Signed, Janeece Blok, NP

## 2024-01-30 NOTE — Patient Instructions (Signed)
 Medication Instructions:  Your physician recommends that you continue on your current medications as directed. Please refer to the Current Medication list given to you today.   *If you need a refill on your cardiac medications before your next appointment, please call your pharmacy*  Lab Work: No labs ordered today  If you have labs (blood work) drawn today and your tests are completely normal, you will receive your results only by: MyChart Message (if you have MyChart) OR A paper copy in the mail If you have any lab test that is abnormal or we need to change your treatment, we will call you to review the results.  Testing/Procedures: No test ordered today   Follow-Up: At [facility], you and your health needs are our priority.  As part of our continuing mission to provide you with exceptional heart care, our providers are all part of one team.  This team includes your primary Cardiologist (physician) and Advanced Practice Providers or APPs (Physician Assistants and Nurse Practitioners) who all work together to provide you with the care you need, when you need it.  Your next appointment:   12 month(s)  Provider:   Lonni Hanson, MD or Tylene Lunch, NP

## 2024-04-22 ENCOUNTER — Other Ambulatory Visit: Payer: Self-pay | Admitting: Family Medicine

## 2024-04-22 DIAGNOSIS — R739 Hyperglycemia, unspecified: Secondary | ICD-10-CM

## 2024-04-22 DIAGNOSIS — I10 Essential (primary) hypertension: Secondary | ICD-10-CM

## 2024-04-23 ENCOUNTER — Other Ambulatory Visit (INDEPENDENT_AMBULATORY_CARE_PROVIDER_SITE_OTHER): Payer: BC Managed Care – PPO

## 2024-04-23 DIAGNOSIS — I10 Essential (primary) hypertension: Secondary | ICD-10-CM | POA: Diagnosis not present

## 2024-04-23 DIAGNOSIS — R739 Hyperglycemia, unspecified: Secondary | ICD-10-CM | POA: Diagnosis not present

## 2024-04-23 LAB — COMPREHENSIVE METABOLIC PANEL WITH GFR
ALT: 19 U/L (ref 0–53)
AST: 22 U/L (ref 0–37)
Albumin: 4.3 g/dL (ref 3.5–5.2)
Alkaline Phosphatase: 62 U/L (ref 39–117)
BUN: 10 mg/dL (ref 6–23)
CO2: 24 meq/L (ref 19–32)
Calcium: 9.1 mg/dL (ref 8.4–10.5)
Chloride: 102 meq/L (ref 96–112)
Creatinine, Ser: 0.8 mg/dL (ref 0.40–1.50)
GFR: 98.85 mL/min (ref >60.00)
Glucose, Bld: 118 mg/dL — ABNORMAL HIGH (ref 70–99)
Potassium: 4.6 meq/L (ref 3.5–5.1)
Sodium: 140 meq/L (ref 135–145)
Total Bilirubin: 0.6 mg/dL (ref 0.2–1.2)
Total Protein: 6.9 g/dL (ref 6.0–8.3)

## 2024-04-23 LAB — LIPID PANEL
Cholesterol: 181 mg/dL (ref 0–200)
HDL: 70.2 mg/dL (ref >39.00)
LDL Cholesterol: 100 mg/dL — ABNORMAL HIGH (ref 0–99)
NonHDL: 110.3
Total CHOL/HDL Ratio: 3
Triglycerides: 51 mg/dL (ref 0.0–149.0)
VLDL: 10.2 mg/dL (ref 0.0–40.0)

## 2024-04-23 LAB — HEMOGLOBIN A1C: Hgb A1c MFr Bld: 5.2 % (ref 4.6–6.5)

## 2024-04-25 ENCOUNTER — Ambulatory Visit: Payer: Self-pay | Admitting: Family Medicine

## 2024-04-26 ENCOUNTER — Other Ambulatory Visit: Payer: Self-pay | Admitting: Family Medicine

## 2024-04-30 ENCOUNTER — Ambulatory Visit (INDEPENDENT_AMBULATORY_CARE_PROVIDER_SITE_OTHER): Payer: BC Managed Care – PPO | Admitting: Family Medicine

## 2024-04-30 ENCOUNTER — Encounter: Payer: Self-pay | Admitting: Family Medicine

## 2024-04-30 VITALS — BP 138/82 | HR 66 | Temp 98.8°F | Ht 69.0 in | Wt 255.0 lb

## 2024-04-30 DIAGNOSIS — Z0000 Encounter for general adult medical examination without abnormal findings: Secondary | ICD-10-CM | POA: Diagnosis not present

## 2024-04-30 DIAGNOSIS — Z1211 Encounter for screening for malignant neoplasm of colon: Secondary | ICD-10-CM

## 2024-04-30 DIAGNOSIS — Z7189 Other specified counseling: Secondary | ICD-10-CM

## 2024-04-30 DIAGNOSIS — I10 Essential (primary) hypertension: Secondary | ICD-10-CM

## 2024-04-30 MED ORDER — METOPROLOL SUCCINATE ER 100 MG PO TB24: 100.0000 mg | ORAL_TABLET | Freq: Every day | ORAL | 3 refills | Status: AC

## 2024-04-30 NOTE — Assessment & Plan Note (Signed)
 Living will d/w pt.  Wife designated if patient were incapacitated.   ?

## 2024-04-30 NOTE — Assessment & Plan Note (Signed)
 Controlled, continue hydrochlorothiazide , metoprolol , amlodipine . Labs d/w pt.  Lipids improved. Defer statin start given lipid improvement.

## 2024-04-30 NOTE — Assessment & Plan Note (Signed)
 Tetanus 2017. PNA d/w pt.  Flu shot encouraged. D/w pt.  Shingles d/w pt.   covid vaccine prev.  Prostate cancer screening and PSA options (with potential risks and benefits of testing vs not testing) were discussed along with recent recs/guidelines.  He declined testing PSA at this point. D/w patient [id number] for colon cancer screening, including IFOB vs. colonoscopy.  Risks and benefits of both were discussed and patient voiced understanding.  Pt elects for: cologuard.  Ordered 2025.  D/w pt.   Living will d/w pt. Wife designated if patient were incapacitated.  Diet and exercise d/w pt.   HIV testing d/w pt. Per patient, was neg on testing with outside labs ~2014. D/w pt about dip tobacco, encouraged cessation.  He is considering options.  HCV screening prev done 1990s.  [facility]    Routine vaccination d/w pt, especially since he has a grandchild.

## 2024-04-30 NOTE — Patient Instructions (Signed)
Take care.  Glad to see you.  I would get a flu shot each fall.   Update me as needed.

## 2024-04-30 NOTE — Progress Notes (Signed)
 CPE- See plan.  Routine anticipatory guidance given to patient.  See health maintenance.  The possibility exists that previously documented standard health maintenance information may have been brought forward from a previous encounter into this note.  If needed, that same information has been updated to reflect the current situation based on today's encounter.    Tetanus 2017. PNA d/w pt.  Flu shot encouraged. D/w pt.  Shingles d/w pt.   covid vaccine prev.  Prostate cancer screening and PSA options (with potential risks and benefits of testing vs not testing) were discussed along with recent recs/guidelines.  He declined testing PSA at this point. D/w patient mz:[id number] for colon cancer screening, including IFOB vs. colonoscopy.  Risks and benefits of both were discussed and patient voiced understanding.  Pt elects for: cologuard.  Ordered 2025.  D/w pt.   Living will d/w pt. Wife designated if patient were incapacitated.  Diet and exercise d/w pt.   HIV testing d/w pt. Per patient, was neg on testing with outside labs ~2014. D/w pt about dip tobacco, encouraged cessation.  He is considering options.  HCV screening prev done 1990s.  [facility]    Routine vaccination d/w pt, especially since he has a grandchild.    Hypertension:    Using medication without problems or lightheadedness: yes Chest pain with exertion:no Edema:no Short of breath: no ASCVD score d/w pt.  Lipids improved.  He has been working on diet.   PMH and SH reviewed  Meds, vitals, and allergies reviewed.   ROS: Per HPI.  Unless specifically indicated otherwise in HPI, the patient denies:  General: fever. Eyes: acute vision changes ENT: sore throat Cardiovascular: chest pain Respiratory: SOB GI: vomiting GU: dysuria Musculoskeletal: acute back pain Derm: acute rash Neuro: acute motor dysfunction Psych: worsening mood Endocrine: polydipsia Heme: bleeding Allergy: hayfever  GEN: nad, alert and  oriented HEENT: mucous membranes moist NECK: supple w/o LA CV: rrr. PULM: ctab, no inc wob ABD: soft, +bs EXT: no edema SKIN: no acute rash  The 10-year ASCVD risk score (Arnett DK, et al., 2019) is: 5.5%   Values used to calculate the score:     Age: 58 years     Clincally relevant sex: Male     Is Non-Hispanic African American: No     Diabetic: No     Tobacco smoker: No     Systolic Blood Pressure: 138 mmHg     Is BP treated: Yes     HDL Cholesterol: 70.2 mg/dL     Total Cholesterol: 181 mg/dL

## 2024-07-23 ENCOUNTER — Other Ambulatory Visit: Payer: Self-pay | Admitting: Internal Medicine

## 2025-04-30 ENCOUNTER — Other Ambulatory Visit

## 2025-05-07 ENCOUNTER — Encounter: Admitting: Family Medicine
